# Patient Record
Sex: Female | Born: 1935
Health system: Southern US, Community
[De-identification: ages and names within clinical notes are randomized; demographics above are authoritative.]

## PROBLEM LIST (undated history)

## (undated) DIAGNOSIS — M1990 Unspecified osteoarthritis, unspecified site: Secondary | ICD-10-CM

## (undated) DIAGNOSIS — F329 Major depressive disorder, single episode, unspecified: Secondary | ICD-10-CM

## (undated) DIAGNOSIS — IMO0001 Reserved for inherently not codable concepts without codable children: Secondary | ICD-10-CM

## (undated) DIAGNOSIS — M545 Low back pain, unspecified: Secondary | ICD-10-CM

## (undated) DIAGNOSIS — K219 Gastro-esophageal reflux disease without esophagitis: Secondary | ICD-10-CM

## (undated) DIAGNOSIS — F419 Anxiety disorder, unspecified: Secondary | ICD-10-CM

## (undated) DIAGNOSIS — N952 Postmenopausal atrophic vaginitis: Secondary | ICD-10-CM

## (undated) DIAGNOSIS — K259 Gastric ulcer, unspecified as acute or chronic, without hemorrhage or perforation: Secondary | ICD-10-CM

## (undated) DIAGNOSIS — K2270 Barrett's esophagus without dysplasia: Secondary | ICD-10-CM

## (undated) DIAGNOSIS — E785 Hyperlipidemia, unspecified: Secondary | ICD-10-CM

## (undated) DIAGNOSIS — M359 Systemic involvement of connective tissue, unspecified: Secondary | ICD-10-CM

## (undated) DIAGNOSIS — R351 Nocturia: Secondary | ICD-10-CM

## (undated) DIAGNOSIS — N343 Urethral syndrome, unspecified: Secondary | ICD-10-CM

## (undated) DIAGNOSIS — N362 Urethral caruncle: Secondary | ICD-10-CM

## (undated) DIAGNOSIS — R339 Retention of urine, unspecified: Secondary | ICD-10-CM

## (undated) DIAGNOSIS — J189 Pneumonia, unspecified organism: Secondary | ICD-10-CM

## (undated) DIAGNOSIS — F32A Depression, unspecified: Secondary | ICD-10-CM

## (undated) DIAGNOSIS — N35919 Unspecified urethral stricture, male, unspecified site: Secondary | ICD-10-CM

## (undated) HISTORY — DX: Postmenopausal atrophic vaginitis: N95.2

## (undated) HISTORY — DX: Anxiety disorder, unspecified: F41.9

## (undated) HISTORY — PX: COLONOSCOPY: SHX174

## (undated) HISTORY — DX: Low back pain, unspecified: M54.50

## (undated) HISTORY — DX: Unspecified urethral stricture, male, unspecified site: N35.919

## (undated) HISTORY — DX: Retention of urine, unspecified: R33.9

## (undated) HISTORY — PX: REDUCTION MAMMAPLASTY: SUR839

## (undated) HISTORY — PX: DILATION AND CURETTAGE OF UTERUS: SHX78

## (undated) HISTORY — PX: ABDOMINAL HYSTERECTOMY: SHX81

## (undated) HISTORY — PX: BLADDER SURGERY: SHX569

## (undated) HISTORY — DX: Low back pain: M54.5

## (undated) HISTORY — DX: Nocturia: R35.1

## (undated) HISTORY — DX: Urethral syndrome, unspecified: N34.3

## (undated) HISTORY — DX: Urethral caruncle: N36.2

## (undated) HISTORY — PX: NASAL SINUS SURGERY: SHX719

## (undated) HISTORY — DX: Reserved for inherently not codable concepts without codable children: IMO0001

---

## 2003-06-13 ENCOUNTER — Other Ambulatory Visit: Payer: Self-pay

## 2005-03-24 ENCOUNTER — Ambulatory Visit: Payer: Self-pay | Admitting: Otolaryngology

## 2008-02-20 ENCOUNTER — Ambulatory Visit: Payer: Self-pay | Admitting: Unknown Physician Specialty

## 2010-09-16 ENCOUNTER — Ambulatory Visit: Payer: Self-pay | Admitting: Internal Medicine

## 2010-11-25 ENCOUNTER — Observation Stay: Payer: Self-pay | Admitting: Cardiology

## 2010-11-27 ENCOUNTER — Emergency Department: Payer: Self-pay | Admitting: Emergency Medicine

## 2010-12-01 ENCOUNTER — Observation Stay: Payer: Self-pay | Admitting: Internal Medicine

## 2010-12-09 ENCOUNTER — Ambulatory Visit: Payer: Self-pay | Admitting: Unknown Physician Specialty

## 2011-03-18 ENCOUNTER — Ambulatory Visit: Payer: Self-pay | Admitting: Unknown Physician Specialty

## 2011-08-11 DIAGNOSIS — J189 Pneumonia, unspecified organism: Secondary | ICD-10-CM

## 2011-08-11 HISTORY — DX: Pneumonia, unspecified organism: J18.9

## 2012-04-16 ENCOUNTER — Emergency Department: Payer: Self-pay | Admitting: Emergency Medicine

## 2012-04-19 ENCOUNTER — Ambulatory Visit: Payer: Self-pay | Admitting: Anesthesiology

## 2012-04-19 LAB — BASIC METABOLIC PANEL
Calcium, Total: 8.8 mg/dL (ref 8.5–10.1)
Chloride: 106 mmol/L (ref 98–107)
Co2: 30 mmol/L (ref 21–32)
Creatinine: 0.91 mg/dL (ref 0.60–1.30)
EGFR (African American): >60
EGFR (Non-African Amer.): >60
Glucose: 97 mg/dL (ref 65–99)
Potassium: 3.9 mmol/L (ref 3.5–5.1)
Sodium: 142 mmol/L (ref 136–145)

## 2012-04-19 LAB — CBC WITH DIFFERENTIAL/PLATELET
Basophil %: 0.5 %
Eosinophil #: 0.3 10*3/uL (ref 0.0–0.7)
HCT: 38.7 % (ref 35.0–47.0)
HGB: 12.8 g/dL (ref 12.0–16.0)
MCH: 29.2 pg (ref 26.0–34.0)
MCHC: 33.1 g/dL (ref 32.0–36.0)
Monocyte #: 0.9 x10 3/mm (ref 0.2–0.9)
Neutrophil #: 5.3 10*3/uL (ref 1.4–6.5)
Neutrophil %: 52.3 %
RBC: 4.38 10*6/uL (ref 3.80–5.20)
RDW: 14.2 % (ref 11.5–14.5)

## 2012-04-21 ENCOUNTER — Ambulatory Visit: Payer: Self-pay | Admitting: Otolaryngology

## 2012-04-25 LAB — WOUND CULTURE

## 2012-05-12 LAB — CULTURE, FUNGUS WITHOUT SMEAR

## 2012-10-06 ENCOUNTER — Ambulatory Visit: Payer: Self-pay

## 2012-10-17 ENCOUNTER — Ambulatory Visit: Payer: Self-pay

## 2013-09-06 ENCOUNTER — Ambulatory Visit: Payer: Self-pay

## 2014-03-13 ENCOUNTER — Ambulatory Visit: Payer: Self-pay | Admitting: Physician Assistant

## 2014-05-07 DIAGNOSIS — R61 Generalized hyperhidrosis: Secondary | ICD-10-CM | POA: Insufficient documentation

## 2014-10-09 DIAGNOSIS — R519 Headache, unspecified: Secondary | ICD-10-CM | POA: Insufficient documentation

## 2014-10-09 DIAGNOSIS — F322 Major depressive disorder, single episode, severe without psychotic features: Secondary | ICD-10-CM | POA: Insufficient documentation

## 2014-10-09 DIAGNOSIS — R51 Headache: Secondary | ICD-10-CM

## 2014-11-27 NOTE — Op Note (Signed)
PATIENT NAME:  Katrina Mckinney, Katrina Mckinney MR#:  [id number] DATE OF BIRTH:  1935/09/21  DATE OF PROCEDURE:  04/21/2012  PREOPERATIVE DIAGNOSIS: Chronic pansinusitis (status post remote sinus surgery on the ethmoid and maxillary sinuses).   POSTOPERATIVE DIAGNOSIS: Chronic pansinusitis (status post remote sinus surgery on the ethmoid and maxillary sinuses).   PROCEDURES:  1. Image guided sinus surgery.  2. Bilateral frontal sinusotomies with tissue removal.  3. Bilateral sphenoidectomies with tissue removal.  4. Revision bilateral anterior and posterior ethmoidectomies with tissue removal.  5. Revision bilateral maxillary sinus antrostomies with tissue removal.   SURGEON: Janalee Dane, MD  ANESTHESIA: General endotracheal.   FINDINGS: The middle turbinate remnants on both sides were prolapsed into the maxillary sinus obstructing outflow. There was a mucopyocele in the left frontal sinus and there was chronic bone changes.   COMPLICATIONS: None.    DESCRIPTION OF PROCEDURE:  The image-guided sinus surgery system mask was attached and registration was carried out in the standard fashion using the appropriate fiduciary points. Calibration of the system was confirmed and extensive review of the CT scan in all three dimensions preoperatively and intraoperatively was carried out.  Each instrument was registered and confirmed for anatomic accuracy.  Attention was directed to the sinus surgery portion of the surgery. The left side was addressed first. The middle turbinate was taken down subtotally with a through-cutting forceps. The uncinate process was then identified and completely resected revealing the natural maxillary sinus ostium. The postsurgically created maxillary antrostomy, which was obstructed with the lateralized middle turbinate remnant, was enlarged, removing diseased tissue. The maxillary antrum was then irrigated copiously with saline. Attention was directed to the ethmoid sinuses where  the postsurgically altered ethmoid sinuse cavities were taken down from anterior to posterior preserving the skull base and the lamina papyracea, removing diseased tissue. After the ethmoid sinuses had been completely cleaned out of polypoid chronically inflamed mucosa preserving the mucosa on the medial side of the middle turbinate remnant along the skull base and along the lamina papyracea, attention was directed to the sphenoid recess. The sphenoid sinus ostium was identified and progressively enlarged with Kerrison Rongeur, staying inferior and medial in orientation, to create a large sphenoid sinus antrostomy, removing diseased tissue. The 30 degree scope was then used to explore the frontal recess. The frontal recess was progressively enlarged meticulously, removing diseased tissue, and the frontal sinus was then copiously irrigated with saline as was the sphenoid sinus. Attention was then directed to the right side where an identical series of procedures was accomplished. Upon completion of the sinus surgery, the Surgiflo was placed. A total of one unit of Surgiflo was used.  Temporary Telfa pledgets were placed and tied over the columella to prevent dislodging.   The patient was then returned to anesthesia, allowed to emerge from anesthesia in the operating room, extubated, and taken to the recovery room in stable condition.  ____________________________ Lenna Sciara. Nadeen Landau, MD jmc:cms D: 04/21/2012 11:57:50 ET T: 04/21/2012 12:55:56 ET JOB#: [id number]  cc: Janalee Dane, MD, <Dictator>  Nicholos Johns MD ELECTRONICALLY SIGNED 04/23/2012 10:47

## 2015-02-13 ENCOUNTER — Encounter: Payer: Self-pay | Admitting: *Deleted

## 2015-02-14 ENCOUNTER — Ambulatory Visit: Payer: Medicare Other | Admitting: Anesthesiology

## 2015-02-14 ENCOUNTER — Encounter: Payer: Self-pay | Admitting: Anesthesiology

## 2015-02-14 ENCOUNTER — Encounter
Admission: RE | Disposition: A | Discharge status: Home / Self Care | Payer: Self-pay | Source: Ambulatory Visit | Attending: Unknown Physician Specialty

## 2015-02-14 ENCOUNTER — Ambulatory Visit
Admission: RE | Admit: 2015-02-14 | Discharge: 2015-02-14 | Disposition: A | Discharge status: Home / Self Care | Payer: Medicare Other | Source: Ambulatory Visit | Attending: Unknown Physician Specialty | Admitting: Unknown Physician Specialty

## 2015-02-14 DIAGNOSIS — E785 Hyperlipidemia, unspecified: Secondary | ICD-10-CM | POA: Diagnosis not present

## 2015-02-14 DIAGNOSIS — Z1211 Encounter for screening for malignant neoplasm of colon: Secondary | ICD-10-CM | POA: Diagnosis present

## 2015-02-14 DIAGNOSIS — Z79899 Other long term (current) drug therapy: Secondary | ICD-10-CM | POA: Diagnosis not present

## 2015-02-14 DIAGNOSIS — K210 Gastro-esophageal reflux disease with esophagitis: Secondary | ICD-10-CM | POA: Insufficient documentation

## 2015-02-14 DIAGNOSIS — D123 Benign neoplasm of transverse colon: Secondary | ICD-10-CM | POA: Diagnosis not present

## 2015-02-14 DIAGNOSIS — D122 Benign neoplasm of ascending colon: Secondary | ICD-10-CM | POA: Insufficient documentation

## 2015-02-14 DIAGNOSIS — K317 Polyp of stomach and duodenum: Secondary | ICD-10-CM | POA: Diagnosis not present

## 2015-02-14 DIAGNOSIS — R12 Heartburn: Secondary | ICD-10-CM | POA: Insufficient documentation

## 2015-02-14 DIAGNOSIS — K648 Other hemorrhoids: Secondary | ICD-10-CM | POA: Diagnosis not present

## 2015-02-14 DIAGNOSIS — I252 Old myocardial infarction: Secondary | ICD-10-CM | POA: Insufficient documentation

## 2015-02-14 DIAGNOSIS — Z79891 Long term (current) use of opiate analgesic: Secondary | ICD-10-CM | POA: Insufficient documentation

## 2015-02-14 DIAGNOSIS — F329 Major depressive disorder, single episode, unspecified: Secondary | ICD-10-CM | POA: Insufficient documentation

## 2015-02-14 DIAGNOSIS — Z86010 Personal history of colonic polyps: Secondary | ICD-10-CM | POA: Insufficient documentation

## 2015-02-14 DIAGNOSIS — M1990 Unspecified osteoarthritis, unspecified site: Secondary | ICD-10-CM | POA: Insufficient documentation

## 2015-02-14 HISTORY — DX: Hyperlipidemia, unspecified: E78.5

## 2015-02-14 HISTORY — DX: Gastric ulcer, unspecified as acute or chronic, without hemorrhage or perforation: K25.9

## 2015-02-14 HISTORY — DX: Depression, unspecified: F32.A

## 2015-02-14 HISTORY — PX: ESOPHAGOGASTRODUODENOSCOPY: SHX5428

## 2015-02-14 HISTORY — PX: COLONOSCOPY WITH PROPOFOL: SHX5780

## 2015-02-14 HISTORY — DX: Major depressive disorder, single episode, unspecified: F32.9

## 2015-02-14 HISTORY — DX: Gastro-esophageal reflux disease without esophagitis: K21.9

## 2015-02-14 HISTORY — DX: Barrett's esophagus without dysplasia: K22.70

## 2015-02-14 HISTORY — DX: Unspecified osteoarthritis, unspecified site: M19.90

## 2015-02-14 SURGERY — COLONOSCOPY WITH PROPOFOL
Anesthesia: General

## 2015-02-14 MED ORDER — SODIUM CHLORIDE 0.9 % IV SOLN
INTRAVENOUS | Status: DC
  Administered 2015-02-14: 09:00:00 via INTRAVENOUS

## 2015-02-14 MED ORDER — LIDOCAINE HCL (CARDIAC) 20 MG/ML IV SOLN
INTRAVENOUS | Status: DC | PRN
  Administered 2015-02-14: 100 mg via INTRAVENOUS

## 2015-02-14 MED ORDER — PROPOFOL INFUSION 10 MG/ML OPTIME
INTRAVENOUS | Status: DC | PRN
  Administered 2015-02-14: 100 ug/kg/min via INTRAVENOUS

## 2015-02-14 MED ORDER — PROPOFOL 10 MG/ML IV BOLUS
INTRAVENOUS | Status: DC | PRN
Start: 2015-02-14 — End: 2015-02-14
  Administered 2015-02-14: 30 mg via INTRAVENOUS
  Administered 2015-02-14: 7 mg via INTRAVENOUS

## 2015-02-14 MED ORDER — ALFENTANIL 500 MCG/ML IJ INJ
INJECTION | INTRAMUSCULAR | Status: DC | PRN
  Administered 2015-02-14: 500 ug via INTRAVENOUS

## 2015-02-14 MED ORDER — GLYCOPYRROLATE 0.2 MG/ML IJ SOLN
INTRAMUSCULAR | Status: DC | PRN
  Administered 2015-02-14: 0.2 mg via INTRAVENOUS

## 2015-02-14 MED ORDER — EPHEDRINE SULFATE 50 MG/ML IJ SOLN
INTRAMUSCULAR | Status: DC | PRN
Start: 2015-02-14 — End: 2015-02-14
  Administered 2015-02-14: 5 mg via INTRAVENOUS

## 2015-02-14 NOTE — Op Note (Signed)
[facility] Gastroenterology Patient Name: Katrina Mckinney Procedure Date: 02/14/2015 9:01 AM MRN: [id number] Account #: [id number] Date of Birth: 1936-03-16 Admit Type: Outpatient Age: 79 Room: [facility] ENDO ROOM 1 Gender: Female Note Status: Finalized Procedure:         Upper GI endoscopy Indications:       Epigastric abdominal pain, Heartburn, Follow-up of                     gastro-esophageal reflux disease Providers:         Manya Silvas, MD Referring MD:      Youlanda Roys. Ola Spurr, MD (Referring MD) Medicines:         Propofol per Anesthesia Complications:     No immediate complications. Procedure:         Pre-Anesthesia Assessment:                    - After reviewing the risks and benefits, the patient was                     deemed in satisfactory condition to undergo the procedure.                    After obtaining informed consent, the endoscope was passed                     under direct vision. Throughout the procedure, the                     patient's blood pressure, pulse, and oxygen saturations                     were monitored continuously. The Olympus GIF-160 endoscope                     (S#. [id number]) was introduced through the mouth, and                     advanced to the second part of duodenum. The upper GI                     endoscopy was accomplished without difficulty. The patient                     tolerated the procedure well. Findings:      LA Grade A (one or more mucosal breaks less than 5 mm, not extending       between tops of 2 mucosal folds) esophagitis with no bleeding was found       40 cm from the incisors. Biopsies were taken with a cold forceps for       histology.      The entire examined stomach was normal.      A single small-medium nodule was found in the duodenal bulb. Biopsies       were taken with a cold forceps for histology.      The 2nd part of the duodenum was normal. Impression:        - LA Grade A reflux  esophagitis. Biopsied.                    - Normal stomach.                    - Nodule found in the duodenum. Biopsied.                    -  Normal 2nd part of the duodenum. Recommendation:    - Await pathology results. Manya Silvas, MD 02/14/2015 9:14:37 AM This report has been signed electronically. Number of Addenda: 0 Note Initiated On: 02/14/2015 9:01 AM      [facility]

## 2015-02-14 NOTE — Anesthesia Postprocedure Evaluation (Signed)
  Anesthesia Post-op Note  Patient: Katrina Mckinney  Procedure(s) Performed: Procedure(s): COLONOSCOPY WITH PROPOFOL (N/A) ESOPHAGOGASTRODUODENOSCOPY (EGD) (N/A)  Anesthesia type:General  Patient location: PACU  Post pain: Pain level controlled  Post assessment: Post-op Vital signs reviewed, Patient's Cardiovascular Status Stable, Respiratory Function Stable, Patent Airway and No signs of Nausea or vomiting  Post vital signs: Reviewed and stable  Last Vitals:  Filed Vitals:   02/14/15 1020  BP: 148/48  Pulse: 70  Temp:   Resp: 16    Level of consciousness: awake, alert  and patient cooperative  Complications: No apparent anesthesia complications

## 2015-02-14 NOTE — Op Note (Signed)
[facility] Gastroenterology Patient Name: Katrina Mckinney Procedure Date: 02/14/2015 9:01 AM MRN: [id number] Account #: [id number] Date of Birth: Jul 09, 1936 Admit Type: Outpatient Age: 79 Room: [facility] ENDO ROOM 1 Gender: Female Note Status: Finalized Procedure:         Colonoscopy Indications:       Personal history of colonic polyps Providers:         Manya Silvas, MD Referring MD:      Youlanda Roys. Ola Spurr, MD (Referring MD) Medicines:         Propofol per Anesthesia Complications:     No immediate complications. Procedure:         Pre-Anesthesia Assessment:                    - After reviewing the risks and benefits, the patient was                     deemed in satisfactory condition to undergo the procedure.                    After obtaining informed consent, the colonoscope was                     passed under direct vision. Throughout the procedure, the                     patient's blood pressure, pulse, and oxygen saturations                     were monitored continuously. The Colonoscope was                     introduced through the anus and advanced to the the cecum,                     identified by appendiceal orifice and ileocecal valve. The                     colonoscopy was performed without difficulty. The patient                     tolerated the procedure well. The quality of the bowel                     preparation was adequate to identify polyps. Findings:      A 10 mm polyp was found in the ascending colon. The polyp was sessile.       The polyp was removed with a hot snare. Resection and retrieval were       complete. Two hemostatic clips were successfully placed (MRI       compatible). There was no bleeding during, and at the end, of the       procedure.      A small polyp was found at the hepatic flexure. The polyp was sessile.       The polyp was removed with a cold snare. Resection and retrieval were       complete.      A  small polyp was found in the transverse colon. The polyp was sessile.       The polyp was removed with a cold snare. Resection and retrieval were       complete.      A diminutive polyp was found in the transverse colon. The  polyp was       sessile. The polyp was removed with a jumbo cold forceps. Resection and       retrieval were complete.      Internal hemorrhoids were found during endoscopy. The hemorrhoids were       small-medium and Grade I (internal hemorrhoids that do not prolapse). Impression:        - One 10 mm polyp in the ascending colon. Resected and                     retrieved. Clips were placed.                    - One small polyp at the hepatic flexure. Resected and                     retrieved.                    - One small polyp in the transverse colon. Resected and                     retrieved.                    - One diminutive polyp in the transverse colon. Resected                     and retrieved.                    - Internal hemorrhoids. Recommendation:    - Await pathology results. Manya Silvas, MD 02/14/2015 9:46:04 AM This report has been signed electronically. Number of Addenda: 0 Note Initiated On: 02/14/2015 9:01 AM Scope Withdrawal Time: 0 hours 19 minutes 7 seconds  Total Procedure Duration: 0 hours 26 minutes 15 seconds       [facility]

## 2015-02-14 NOTE — Transfer of Care (Signed)
Immediate Anesthesia Transfer of Care Note  Patient: Katrina Mckinney [id number]  Procedure(s) Performed: Procedure(s): COLONOSCOPY WITH PROPOFOL (N/A) ESOPHAGOGASTRODUODENOSCOPY (EGD) (N/A)  Patient Location: PACU  Anesthesia Type:General  Level of Consciousness: awake and sedated  Airway & Oxygen Therapy: Patient Spontanous Breathing and Patient connected to nasal cannula oxygen  Post-op Assessment: Report given to RN and Post -op Vital signs reviewed and stable  Post vital signs: Reviewed and stable  Last Vitals:  Filed Vitals:   02/14/15 0951  BP: 144/52  Pulse:   Temp: 36.1 C  Resp: 13    Complications: No apparent anesthesia complications

## 2015-02-14 NOTE — H&P (Signed)
Primary Care Physician:  Adrian Prows, MD Primary Gastroenterologist:  Dr. Vira Agar  Pre-Procedure History & Physical: HPI:  Katrina Mckinney is a 79 y.o. female is here for an endoscopy and colonoscopy.   Past Medical History  Diagnosis Date  . GERD (gastroesophageal reflux disease)   . Depression   . Barrett's esophagus   . Hyperlipidemia   . Gastric ulcer   . Arthritis     Past Surgical History  Procedure Laterality Date  . Abdominal hysterectomy    . Reduction mammaplasty    . Colonoscopy      Prior to Admission medications   Medication Sig Start Date End Date Taking? Authorizing Provider  citalopram (CELEXA) 20 MG tablet Take 20 mg by mouth daily.   Yes Historical Provider, MD  HYDROcodone-acetaminophen (NORCO/VICODIN) 5-325 MG per tablet Take 1 tablet by mouth every 6 (six) hours as needed for moderate pain.   Yes Historical Provider, MD  mirtazapine (REMERON) 30 MG tablet Take 30 mg by mouth at bedtime.   Yes Historical Provider, MD  omeprazole (PRILOSEC) 20 MG capsule Take 20 mg by mouth daily.   Yes Historical Provider, MD  ranitidine (ZANTAC) 150 MG tablet Take 150 mg by mouth 2 (two) times daily.   Yes Historical Provider, MD  traMADol-acetaminophen (ULTRACET) 37.5-325 MG per tablet Take 1 tablet by mouth every 6 (six) hours as needed.   Yes Historical Provider, MD    Allergies as of 01/29/2015  . (Not on File)    History reviewed. No pertinent family history.  History   Social History  . Marital Status: Widowed    Spouse Name: N/A  . Number of Children: N/A  . Years of Education: N/A   Occupational History  . Not on file.   Social History Main Topics  . Smoking status: Not on file  . Smokeless tobacco: Not on file  . Alcohol Use: Not on file  . Drug Use: Not on file  . Sexual Activity: Not on file   Other Topics Concern  . Not on file   Social History Narrative    Review of Systems: See HPI, otherwise negative ROS  Physical  Exam: There were no vitals taken for this visit. General:   Alert,  pleasant and cooperative in NAD Head:  Normocephalic and atraumatic. Neck:  Supple; no masses or thyromegaly. Lungs:  Clear throughout to auscultation.    Heart:  Regular rate and rhythm. Abdomen:  Soft, nontender and nondistended. Normal bowel sounds, without guarding, and without rebound.   Neurologic:  Alert and  oriented x4;  grossly normal neurologically.  Impression/Plan: Katrina Mckinney is here for an endoscopy and colonoscopy to be performed for personal history of colon polyps and GERD and epigastric Abd pain.  Risks, benefits, limitations, and alternatives regarding  endoscopy and colonoscopy have been reviewed with the patient.  Questions have been answered.  All parties agreeable.   Gaylyn Cheers, MD  02/14/2015, 8:44 AM   Primary Care Physician:  Adrian Prows, MD Primary Gastroenterologist:  Dr. Vira Agar  Pre-Procedure History & Physical: HPI:  Katrina Mckinney is a 79 y.o. female is here for an endoscopy and colonoscopy.   Past Medical History  Diagnosis Date  . GERD (gastroesophageal reflux disease)   . Depression   . Barrett's esophagus   . Hyperlipidemia   . Gastric ulcer   . Arthritis     Past Surgical History  Procedure Laterality Date  . Abdominal hysterectomy    . Reduction  mammaplasty    . Colonoscopy      Prior to Admission medications   Medication Sig Start Date End Date Taking? Authorizing Provider  citalopram (CELEXA) 20 MG tablet Take 20 mg by mouth daily.   Yes Historical Provider, MD  HYDROcodone-acetaminophen (NORCO/VICODIN) 5-325 MG per tablet Take 1 tablet by mouth every 6 (six) hours as needed for moderate pain.   Yes Historical Provider, MD  mirtazapine (REMERON) 30 MG tablet Take 30 mg by mouth at bedtime.   Yes Historical Provider, MD  omeprazole (PRILOSEC) 20 MG capsule Take 20 mg by mouth daily.   Yes Historical Provider, MD  ranitidine (ZANTAC) 150 MG  tablet Take 150 mg by mouth 2 (two) times daily.   Yes Historical Provider, MD  traMADol-acetaminophen (ULTRACET) 37.5-325 MG per tablet Take 1 tablet by mouth every 6 (six) hours as needed.   Yes Historical Provider, MD    Allergies as of 01/29/2015  . (Not on File)    History reviewed. No pertinent family history.  History   Social History  . Marital Status: Widowed    Spouse Name: N/A  . Number of Children: N/A  . Years of Education: N/A   Occupational History  . Not on file.   Social History Main Topics  . Smoking status: Not on file  . Smokeless tobacco: Not on file  . Alcohol Use: Not on file  . Drug Use: Not on file  . Sexual Activity: Not on file   Other Topics Concern  . Not on file   Social History Narrative    Review of Systems: See HPI, otherwise negative ROS  Physical Exam: There were no vitals taken for this visit. General:   Alert,  pleasant and cooperative in NAD Head:  Normocephalic and atraumatic. Neck:  Supple; no masses or thyromegaly. Lungs:  Clear throughout to auscultation.    Heart:  Regular rate and rhythm. Abdomen:  Soft, nontender and nondistended. Normal bowel sounds, without guarding, and without rebound.   Neurologic:  Alert and  oriented x4;  grossly normal neurologically.  Impression/Plan: Katrina Mckinney [id number] is here for an endoscopy and colonoscopy to be performed for [facility] of colon polyps aNd GERD and epigastric abd pain  Risks, benefits, limitations, and alternatives regarding  endoscopy and colonoscopy have been reviewed with the patient.  Questions have been answered.  All parties agreeable.   Gaylyn Cheers, MD  02/14/2015, 8:44 AM

## 2015-02-14 NOTE — Anesthesia Preprocedure Evaluation (Addendum)
Anesthesia Evaluation  Patient identified by MRN, date of birth, ID band Patient awake    Reviewed: Allergy & Precautions, H&P , NPO status , Patient's Chart, lab work & pertinent test results, reviewed documented beta blocker date and time   Airway Mallampati: II  TM Distance: >3 FB Neck ROM: full    Dental  (+) Chipped   Pulmonary neg pulmonary ROS,  breath sounds clear to auscultation  Pulmonary exam normal       Cardiovascular - Past MI negative cardio ROS Normal cardiovascular examRhythm:regular Rate:Normal     Neuro/Psych PSYCHIATRIC DISORDERS negative neurological ROS     GI/Hepatic Neg liver ROS, PUD, GERD-  Medicated and Controlled,  Endo/Other  negative endocrine ROS  Renal/GU negative Renal ROS  negative genitourinary   Musculoskeletal  (+) Arthritis -,   Abdominal   Peds  Hematology negative hematology ROS (+)   Anesthesia Other Findings   Reproductive/Obstetrics negative OB ROS                           HPI Past Medical History  Diagnosis Date  . GERD (gastroesophageal reflux disease)   . Depression   . Barrett's esophagus   . Hyperlipidemia   . Gastric ulcer   . Arthritis     Anesthesia Physical Anesthesia Plan  ASA: III  Anesthesia Plan: General   Post-op Pain Management:    Induction:   Airway Management Planned:   Additional Equipment:   Intra-op Plan:   Post-operative Plan:   Informed Consent: I have reviewed the patients History and Physical, chart, labs and discussed the procedure including the risks, benefits and alternatives for the proposed anesthesia with the patient or authorized representative who has indicated his/her understanding and acceptance.   Dental Advisory Given  Plan Discussed with: Anesthesiologist, CRNA and Surgeon  Anesthesia Plan Comments:         Anesthesia Quick Evaluation

## 2015-02-15 ENCOUNTER — Encounter: Payer: Self-pay | Admitting: Unknown Physician Specialty

## 2015-02-18 LAB — SURGICAL PATHOLOGY

## 2015-06-17 ENCOUNTER — Other Ambulatory Visit: Payer: Self-pay | Admitting: Infectious Diseases

## 2015-06-17 DIAGNOSIS — M546 Pain in thoracic spine: Secondary | ICD-10-CM

## 2015-06-17 DIAGNOSIS — G44221 Chronic tension-type headache, intractable: Secondary | ICD-10-CM

## 2015-06-17 DIAGNOSIS — R634 Abnormal weight loss: Secondary | ICD-10-CM

## 2015-06-17 DIAGNOSIS — R61 Generalized hyperhidrosis: Secondary | ICD-10-CM

## 2015-06-18 DIAGNOSIS — K760 Fatty (change of) liver, not elsewhere classified: Secondary | ICD-10-CM | POA: Insufficient documentation

## 2015-06-21 ENCOUNTER — Encounter: Payer: Self-pay | Admitting: *Deleted

## 2015-06-25 ENCOUNTER — Ambulatory Visit: Payer: Medicare Other

## 2015-06-25 ENCOUNTER — Ambulatory Visit: Admission: RE | Admit: 2015-06-25 | Payer: Medicare Other | Source: Ambulatory Visit

## 2015-06-27 ENCOUNTER — Ambulatory Visit (INDEPENDENT_AMBULATORY_CARE_PROVIDER_SITE_OTHER): Payer: Medicare Other | Admitting: Urology

## 2015-06-27 ENCOUNTER — Encounter: Payer: Self-pay | Admitting: Urology

## 2015-06-27 VITALS — BP 151/72 | HR 80 | Ht 64.0 in | Wt 158.1 lb

## 2015-06-27 DIAGNOSIS — R350 Frequency of micturition: Secondary | ICD-10-CM | POA: Diagnosis not present

## 2015-06-27 DIAGNOSIS — IMO0002 Reserved for concepts with insufficient information to code with codable children: Secondary | ICD-10-CM

## 2015-06-27 DIAGNOSIS — N359 Urethral stricture, unspecified: Secondary | ICD-10-CM | POA: Diagnosis not present

## 2015-06-27 LAB — URINALYSIS, COMPLETE
Bilirubin, UA: NEGATIVE
Glucose, UA: NEGATIVE
Ketones, UA: NEGATIVE
Leukocytes, UA: NEGATIVE
NITRITE UA: NEGATIVE
PH UA: 5 (ref 5.0–7.5)
Specific Gravity, UA: >1.03 — ABNORMAL HIGH (ref 1.005–1.030)
UUROB: 0.2 mg/dL (ref 0.2–1.0)

## 2015-06-27 LAB — MICROSCOPIC EXAMINATION
BACTERIA UA: NONE SEEN
RBC MICROSCOPIC, UA: NONE SEEN /HPF (ref 0–?)

## 2015-06-27 LAB — BLADDER SCAN AMB NON-IMAGING: SCAN RESULT: 0

## 2015-06-27 NOTE — Progress Notes (Signed)
06/27/2015 8:38 AM   Katrina Mckinney February 21, 1936 [id number]  Referring provider: Adrian Prows, MD [place], [place] [address]  Chief Complaint  Patient presents with  . Urinary Frequency    HPI: Patient is a 79 year old white female who presents today for requesting urethral dilation.  She states the doctors at [facility] told her she needs her urethra dilated every four months.  Today, she is experiencing frequency, nocturia, incontinence and intermittency.  She is not having gross hematuria, dysuria or suprapubic pain.  She is not having fevers, chills, nausea or vomiting.     PMH: Past Medical History  Diagnosis Date  . GERD (gastroesophageal reflux disease)   . Depression   . Barrett's esophagus   . Hyperlipidemia   . Gastric ulcer   . Arthritis   . Atrophic vaginitis   . Urethral syndrome   . Low back pain   . Incomplete bladder emptying   . Anxiety   . Urethral caruncle   . Nocturia   . Urethral stricture   . Hypertension   . Frequency     Surgical History: Past Surgical History  Procedure Laterality Date  . Abdominal hysterectomy    . Reduction mammaplasty    . Colonoscopy    . Colonoscopy with propofol N/A 02/14/2015    Procedure: COLONOSCOPY WITH PROPOFOL;  Surgeon: Manya Silvas, MD;  Location: [facility] ENDOSCOPY;  Service: Endoscopy;  Laterality: N/A;  . Esophagogastroduodenoscopy N/A 02/14/2015    Procedure: ESOPHAGOGASTRODUODENOSCOPY (EGD);  Surgeon: Manya Silvas, MD;  Location: [facility] ENDOSCOPY;  Service: Endoscopy;  Laterality: N/A;  . Nasal sinus surgery      x 3  . Bladder surgery    . Dilation and curettage of uterus      Home Medications:    Medication List       This list is accurate as of: 06/27/15 11:59 PM.  Always use your most recent med list.               cetirizine 10 MG tablet  Commonly known as:  ZYRTEC  Take 10 mg by mouth.     citalopram 10 MG tablet    Commonly known as:  CELEXA  TAKE 1 TABLET (10 MG TOTAL) BY MOUTH ONCE DAILY.     fluticasone 50 MCG/ACT nasal spray  Commonly known as:  FLONASE  2 sprays by Each Nare route Two (2) times a day.     HYDROcodone-acetaminophen 5-325 MG tablet  Commonly known as:  NORCO/VICODIN  Take 1 tablet by mouth every 6 (six) hours as needed for moderate pain.     mirtazapine 30 MG tablet  Commonly known as:  REMERON  Take 30 mg by mouth at bedtime.     NASONEX 50 MCG/ACT nasal spray  Generic drug:  mometasone  Place into the nose.     omeprazole 20 MG capsule  Commonly known as:  PRILOSEC  Take 20 mg by mouth daily.     ranitidine 150 MG tablet  Commonly known as:  ZANTAC  Take 150 mg by mouth 2 (two) times daily.     sucralfate 1 G tablet  Commonly known as:  CARAFATE  Take by mouth.     traMADol-acetaminophen 37.5-325 MG tablet  Commonly known as:  ULTRACET  Take 1 tablet by mouth every 6 (six) hours as needed.     traZODone 50 MG tablet  Commonly known as:  DESYREL  TAKE 1 TABLET NIGHTLY AS NEEDED FOR SLEEP. START 1/2 TAB AT NIGHT, THEN INCREASE TO 1 AFTER 1 WEEK     Vitamin D (Ergocalciferol) 50000 UNITS Caps capsule  Commonly known as:  DRISDOL  Take by mouth.        Allergies:  Allergies  Allergen Reactions  . Aspirin   . Celecoxib Other (See Comments)  . Penicillin G Other (See Comments)    Mouth breaks out.  Marland Kitchen Penicillins     Family History: Family History  Problem Relation Age of Onset  . Kidney disease Neg Hx   . Bladder Cancer Neg Hx     Social History:  reports that she has been smoking.  She does not have any smokeless tobacco history on file. She reports that she does not drink alcohol or use illicit drugs.  ROS: UROLOGY Frequent Urination?: Yes Hard to postpone urination?: No Burning/pain with urination?: No Get up at night to urinate?: Yes Leakage of urine?: Yes Urine stream starts and stops?: Yes Trouble starting stream?: No Do you have  to strain to urinate?: No Blood in urine?: No Urinary tract infection?: No Sexually transmitted disease?: No Injury to kidneys or bladder?: No Painful intercourse?: No Weak stream?: No Currently pregnant?: No Vaginal bleeding?: No Last menstrual period?: n  Gastrointestinal Nausea?: Yes Vomiting?: No Indigestion/heartburn?: Yes Diarrhea?: No Constipation?: No  Constitutional Fever: No Night sweats?: No Weight loss?: Yes Fatigue?: No  Skin Skin rash/lesions?: No Itching?: No  Eyes Blurred vision?: No Double vision?: No  Ears/Nose/Throat Sore throat?: No Sinus problems?: Yes  Hematologic/Lymphatic Swollen glands?: No Easy bruising?: Yes  Cardiovascular Leg swelling?: No Chest pain?: No  Respiratory Cough?: No Shortness of breath?: No  Endocrine Excessive thirst?: No  Musculoskeletal Back pain?: No Joint pain?: No  Neurological Headaches?: Yes Dizziness?: No  Psychologic Depression?: No Anxiety?: No  Physical Exam: BP 151/72 mmHg  Pulse 80  Ht 5\' 4"  (1.626 m)  Wt 158 lb 1.6 oz (71.714 kg)  BMI 27.12 kg/m2  Constitutional: Well nourished. Alert and oriented, No acute distress. HEENT: [place] AT, moist mucus membranes. Trachea midline, no masses. Cardiovascular: No clubbing, cyanosis, or edema. Respiratory: Normal respiratory effort, no increased work of breathing. GI: Abdomen is soft, non tender, non distended, no abdominal masses. Liver and spleen not palpable.  No hernias appreciated.  Stool sample for occult testing is not indicated.   GU: No CVA tenderness.  No bladder fullness or masses.  Atrophic external genitalia, normal pubic hair distribution, no lesions.  Normal urethral meatus, no lesions, no prolapse, no discharge.   No urethral masses, tenderness and/or tenderness. No bladder fullness, tenderness or masses.  Skin: No rashes, bruises or suspicious lesions. Lymph: No cervical or inguinal adenopathy. Neurologic: Grossly intact, no focal  deficits, moving all 4 extremities. Psychiatric: Normal mood and affect.  Laboratory Data: Lab Results  Component Value Date   WBC 10.0 04/19/2012   HGB 12.8 04/19/2012   HCT 38.7 04/19/2012   MCV 88 04/19/2012   PLT 227 04/19/2012    Lab Results  Component Value Date   CREATININE 0.91 04/19/2012     Urinalysis Results for orders placed or performed in visit on 06/27/15  CULTURE, URINE COMPREHENSIVE  Result Value Ref Range   Urine Culture, Comprehensive Final report    Result 1 Comment   Microscopic Examination  Result Value Ref Range   WBC, UA 0-5 0 -  5 /hpf   RBC, UA None seen 0 -  2 /hpf  Epithelial Cells (non renal) 0-10 0 - 10 /hpf   Mucus, UA Present (A) Not Estab.   Bacteria, UA None seen None seen/Few  Urinalysis, Complete  Result Value Ref Range   Specific Gravity, UA >1.030 (H) 1.005 - 1.030   pH, UA 5.0 5.0 - 7.5   Color, UA Yellow Yellow   Appearance Ur Clear Clear   Leukocytes, UA Negative Negative   Protein, UA Trace (A) Negative/Trace   Glucose, UA Negative Negative   Ketones, UA Negative Negative   RBC, UA Trace (A) Negative   Bilirubin, UA Negative Negative   Urobilinogen, Ur 0.2 0.2 - 1.0 mg/dL   Nitrite, UA Negative Negative   Microscopic Examination See below:   BLADDER SCAN AMB NON-IMAGING  Result Value Ref Range   Scan Result 0    Procedure: Patient is placed in stirrups and her urethral meatus and vulva are cleansed with Betadine.  2% Lidocaine jelly was inserted into her urethra.  I then dilated her with Elby Showers sounds to a 32f without difficultly.  She tolerated the procedure well.  She will return in 4 months.  Assessment & Plan:    1. Urethral stenosis:   Patient was dilated today.  She will return in 4 months for reevaluation.    2. Urinary frequency:    Patient has a long standing history of urinary frequency.  She has been tried on anticholinergics, Myrbetriq and PTNS without relief of her symptoms.  She would like to continue  the urethral dilations.    - Urinalysis, Complete - CULTURE, URINE COMPREHENSIVE - BLADDER SCAN AMB NON-IMAGING   Return in about 4 months (around 10/25/2015) for dilation.  Zara Council, [facility] [address], [place], [place] [address] [phone]

## 2015-06-28 ENCOUNTER — Ambulatory Visit
Admission: RE | Admit: 2015-06-28 | Discharge: 2015-06-28 | Disposition: A | Discharge status: Home / Self Care | Payer: Medicare Other | Source: Ambulatory Visit | Attending: Infectious Diseases | Admitting: Infectious Diseases

## 2015-06-28 DIAGNOSIS — R918 Other nonspecific abnormal finding of lung field: Secondary | ICD-10-CM | POA: Diagnosis not present

## 2015-06-28 DIAGNOSIS — R634 Abnormal weight loss: Secondary | ICD-10-CM | POA: Diagnosis present

## 2015-06-28 DIAGNOSIS — J432 Centrilobular emphysema: Secondary | ICD-10-CM | POA: Diagnosis not present

## 2015-06-28 DIAGNOSIS — R911 Solitary pulmonary nodule: Secondary | ICD-10-CM | POA: Diagnosis not present

## 2015-06-28 DIAGNOSIS — M546 Pain in thoracic spine: Secondary | ICD-10-CM | POA: Diagnosis present

## 2015-06-28 DIAGNOSIS — R61 Generalized hyperhidrosis: Secondary | ICD-10-CM | POA: Diagnosis present

## 2015-06-28 DIAGNOSIS — G44221 Chronic tension-type headache, intractable: Secondary | ICD-10-CM

## 2015-06-28 DIAGNOSIS — I2510 Atherosclerotic heart disease of native coronary artery without angina pectoris: Secondary | ICD-10-CM | POA: Diagnosis not present

## 2015-06-28 MED ORDER — IOHEXOL 300 MG/ML  SOLN
75.0000 mL | Freq: Once | INTRAMUSCULAR | Status: AC | PRN
  Administered 2015-06-28: 75 mL via INTRAVENOUS

## 2015-06-29 LAB — CULTURE, URINE COMPREHENSIVE

## 2015-07-01 DIAGNOSIS — IMO0002 Reserved for concepts with insufficient information to code with codable children: Secondary | ICD-10-CM | POA: Insufficient documentation

## 2015-07-01 DIAGNOSIS — R350 Frequency of micturition: Secondary | ICD-10-CM | POA: Insufficient documentation

## 2015-07-09 ENCOUNTER — Other Ambulatory Visit
Admission: RE | Admit: 2015-07-09 | Discharge: 2015-07-09 | Disposition: A | Discharge status: Home / Self Care | Payer: Medicare Other | Source: Ambulatory Visit | Attending: Nurse Practitioner | Admitting: Nurse Practitioner

## 2015-07-09 DIAGNOSIS — R197 Diarrhea, unspecified: Secondary | ICD-10-CM | POA: Insufficient documentation

## 2015-07-09 LAB — C DIFFICILE QUICK SCREEN W PCR REFLEX
C Diff antigen: NEGATIVE
C Diff interpretation: NEGATIVE
C Diff toxin: NEGATIVE

## 2015-07-11 LAB — STOOL CULTURE

## 2015-07-11 LAB — GIARDIA, EIA; OVA/PARASITE: GIARDIA AG STL: NEGATIVE

## 2015-07-11 LAB — O&P RESULT

## 2015-08-14 ENCOUNTER — Other Ambulatory Visit: Payer: Self-pay | Admitting: Infectious Diseases

## 2015-08-14 DIAGNOSIS — Z1231 Encounter for screening mammogram for malignant neoplasm of breast: Secondary | ICD-10-CM

## 2015-08-22 ENCOUNTER — Ambulatory Visit: Payer: Medicare Other | Attending: Infectious Diseases

## 2015-11-27 ENCOUNTER — Encounter: Payer: Self-pay | Admitting: *Deleted

## 2015-12-03 ENCOUNTER — Ambulatory Visit: Payer: Medicare Other | Admitting: Anesthesiology

## 2015-12-03 ENCOUNTER — Ambulatory Visit
Admission: RE | Admit: 2015-12-03 | Discharge: 2015-12-03 | Disposition: A | Discharge status: Home / Self Care | Payer: Medicare Other | Source: Ambulatory Visit | Attending: Ophthalmology | Admitting: Ophthalmology

## 2015-12-03 ENCOUNTER — Encounter
Admission: RE | Disposition: A | Discharge status: Home / Self Care | Payer: Self-pay | Source: Ambulatory Visit | Attending: Ophthalmology

## 2015-12-03 DIAGNOSIS — H2512 Age-related nuclear cataract, left eye: Secondary | ICD-10-CM | POA: Diagnosis present

## 2015-12-03 DIAGNOSIS — M549 Dorsalgia, unspecified: Secondary | ICD-10-CM | POA: Diagnosis not present

## 2015-12-03 DIAGNOSIS — N319 Neuromuscular dysfunction of bladder, unspecified: Secondary | ICD-10-CM | POA: Diagnosis not present

## 2015-12-03 DIAGNOSIS — Z8711 Personal history of peptic ulcer disease: Secondary | ICD-10-CM | POA: Diagnosis not present

## 2015-12-03 DIAGNOSIS — F418 Other specified anxiety disorders: Secondary | ICD-10-CM | POA: Diagnosis not present

## 2015-12-03 DIAGNOSIS — K219 Gastro-esophageal reflux disease without esophagitis: Secondary | ICD-10-CM | POA: Insufficient documentation

## 2015-12-03 DIAGNOSIS — Z880 Allergy status to penicillin: Secondary | ICD-10-CM | POA: Diagnosis not present

## 2015-12-03 DIAGNOSIS — F17200 Nicotine dependence, unspecified, uncomplicated: Secondary | ICD-10-CM | POA: Insufficient documentation

## 2015-12-03 DIAGNOSIS — G8929 Other chronic pain: Secondary | ICD-10-CM | POA: Insufficient documentation

## 2015-12-03 DIAGNOSIS — M1990 Unspecified osteoarthritis, unspecified site: Secondary | ICD-10-CM | POA: Insufficient documentation

## 2015-12-03 DIAGNOSIS — Z79899 Other long term (current) drug therapy: Secondary | ICD-10-CM | POA: Insufficient documentation

## 2015-12-03 DIAGNOSIS — K2270 Barrett's esophagus without dysplasia: Secondary | ICD-10-CM | POA: Diagnosis not present

## 2015-12-03 DIAGNOSIS — F329 Major depressive disorder, single episode, unspecified: Secondary | ICD-10-CM | POA: Insufficient documentation

## 2015-12-03 HISTORY — PX: CATARACT EXTRACTION W/PHACO: SHX586

## 2015-12-03 SURGERY — PHACOEMULSIFICATION, CATARACT, WITH IOL INSERTION
Anesthesia: Monitor Anesthesia Care | Site: Eye | Laterality: Left | Wound class: Clean

## 2015-12-03 MED ORDER — MOXIFLOXACIN HCL 0.5 % OP SOLN
OPHTHALMIC | Status: DC | PRN
  Administered 2015-12-03: 1 [drp] via OPHTHALMIC

## 2015-12-03 MED ORDER — NA CHONDROIT SULF-NA HYALURON 40-17 MG/ML IO SOLN
INTRAOCULAR | Status: DC | PRN
Start: 2015-12-03 — End: 2015-12-03
  Administered 2015-12-03: 1 mL via INTRAOCULAR

## 2015-12-03 MED ORDER — FENTANYL CITRATE (PF) 100 MCG/2ML IJ SOLN: 25.0000 ug | INTRAMUSCULAR | Status: DC | PRN

## 2015-12-03 MED ORDER — MOXIFLOXACIN HCL 0.5 % OP SOLN: 1.0000 [drp] | OPHTHALMIC | Status: DC | PRN

## 2015-12-03 MED ORDER — CARBACHOL 0.01 % IO SOLN
INTRAOCULAR | Status: DC | PRN
  Administered 2015-12-03: 0.5 mL via INTRAOCULAR

## 2015-12-03 MED ORDER — FENTANYL CITRATE (PF) 100 MCG/2ML IJ SOLN
INTRAMUSCULAR | Status: DC | PRN
  Administered 2015-12-03: 25 ug via INTRAVENOUS

## 2015-12-03 MED ORDER — SODIUM CHLORIDE 0.9 % IV SOLN
INTRAVENOUS | Status: DC
Start: 2015-12-03 — End: 2015-12-03
  Administered 2015-12-03: 09:00:00 via INTRAVENOUS

## 2015-12-03 MED ORDER — POVIDONE-IODINE 5 % OP SOLN
1.0000 "application " | Freq: Once | OPHTHALMIC | Status: AC
  Administered 2015-12-03: 1 via OPHTHALMIC

## 2015-12-03 MED ORDER — EPINEPHRINE HCL 1 MG/ML IJ SOLN
INTRAMUSCULAR | Status: AC
  Filled 2015-12-03: qty 1

## 2015-12-03 MED ORDER — ONDANSETRON HCL 4 MG/2ML IJ SOLN
4.0000 mg | Freq: Once | INTRAMUSCULAR | Status: DC | PRN
Start: 2015-12-03 — End: 2015-12-03

## 2015-12-03 MED ORDER — NA CHONDROIT SULF-NA HYALURON 40-17 MG/ML IO SOLN
INTRAOCULAR | Status: AC
  Filled 2015-12-03: qty 1

## 2015-12-03 MED ORDER — ARMC OPHTHALMIC DILATING GEL
1.0000 | OPHTHALMIC | Status: AC | PRN
Start: 2015-12-03 — End: 2015-12-03
  Administered 2015-12-03 (×2): 1 via OPHTHALMIC

## 2015-12-03 MED ORDER — MIDAZOLAM HCL 2 MG/2ML IJ SOLN
INTRAMUSCULAR | Status: DC | PRN
  Administered 2015-12-03: 0.5 mg via INTRAVENOUS

## 2015-12-03 MED ORDER — EPINEPHRINE HCL 1 MG/ML IJ SOLN
INTRAMUSCULAR | Status: DC | PRN
  Administered 2015-12-03: 10:00:00 via OPHTHALMIC

## 2015-12-03 MED ORDER — CEFUROXIME OPHTHALMIC INJECTION 1 MG/0.1 ML
INJECTION | OPHTHALMIC | Status: AC
  Filled 2015-12-03: qty 0.1

## 2015-12-03 MED ORDER — TETRACAINE HCL 0.5 % OP SOLN
1.0000 [drp] | Freq: Once | OPHTHALMIC | Status: AC
  Administered 2015-12-03: 1 [drp] via OPHTHALMIC

## 2015-12-03 SURGICAL SUPPLY — 23 items
CANNULA ANT/CHMB 27GA (MISCELLANEOUS) ×3 IMPLANT
CUP MEDICINE 2OZ PLAST GRAD ST (MISCELLANEOUS) ×3 IMPLANT
GLOVE BIO SURGEON STRL SZ8 (GLOVE) ×3 IMPLANT
GLOVE BIOGEL M 6.5 STRL (GLOVE) ×3 IMPLANT
GLOVE SURG LX 8.0 MICRO (GLOVE) ×2
GLOVE SURG LX STRL 8.0 MICRO (GLOVE) ×1 IMPLANT
GOWN STRL REUS W/ TWL LRG LVL3 (GOWN DISPOSABLE) ×2 IMPLANT
GOWN STRL REUS W/TWL LRG LVL3 (GOWN DISPOSABLE) ×4
LENS IOL TECNIS 21 (Intraocular Lens) ×1 IMPLANT
LENS IOL TECNIS 21.0 (Intraocular Lens) ×2 IMPLANT
LENS IOL TECNIS MONO 1P 21.0 (Intraocular Lens) ×1 IMPLANT
PACK CATARACT (MISCELLANEOUS) ×3 IMPLANT
PACK CATARACT BRASINGTON LX (MISCELLANEOUS) ×3 IMPLANT
PACK EYE AFTER SURG (MISCELLANEOUS) ×3 IMPLANT
SOL BSS BAG (MISCELLANEOUS) ×3
SOL PREP PVP 2OZ (MISCELLANEOUS) ×3
SOLUTION BSS BAG (MISCELLANEOUS) ×1 IMPLANT
SOLUTION PREP PVP 2OZ (MISCELLANEOUS) ×1 IMPLANT
SYR 3ML LL SCALE MARK (SYRINGE) ×3 IMPLANT
SYR 5ML LL (SYRINGE) ×3 IMPLANT
SYR TB 1ML 27GX1/2 LL (SYRINGE) ×3 IMPLANT
WATER STERILE IRR 1000ML POUR (IV SOLUTION) ×3 IMPLANT
WIPE NON LINTING 3.25X3.25 (MISCELLANEOUS) ×3 IMPLANT

## 2015-12-03 NOTE — H&P (Signed)
  All labs reviewed. Abnormal studies sent to patients PCP when indicated.  Previous H&P reviewed, patient examined, there are NO CHANGES.  Katrina Mckinney LOUIS4/25/20179:40 AM

## 2015-12-03 NOTE — Anesthesia Procedure Notes (Signed)
Date/Time: 12/03/2015 9:51 AM Performed by: Kennon Holter Pre-anesthesia Checklist: Timeout performed, Patient being monitored, Suction available, Emergency Drugs available and Patient identified Oxygen Delivery Method: Nasal cannula Placement Confirmation: positive ETCO2

## 2015-12-03 NOTE — Anesthesia Postprocedure Evaluation (Signed)
Anesthesia Post Note  Patient: Katrina Mckinney [id number]  Procedure(s) Performed: Procedure(s) (LRB): CATARACT EXTRACTION PHACO AND INTRAOCULAR LENS PLACEMENT (IOC) (Left)  Anesthesia Type: MAC Level of consciousness: awake and alert and oriented Pain management: pain level controlled Vital Signs Assessment: post-procedure vital signs reviewed and stable Respiratory status: spontaneous breathing Cardiovascular status: blood pressure returned to baseline Anesthetic complications: no    Last Vitals:  Filed Vitals:   12/03/15 1007 12/03/15 1013  BP:  144/48  Pulse:  66  Temp: 36.9 C 36.9 C  Resp:  16    Last Pain: There were no vitals filed for this visit.               Lucus Lambertson

## 2015-12-03 NOTE — Op Note (Signed)
PREOPERATIVE DIAGNOSIS:  Nuclear sclerotic cataract of the left eye.   POSTOPERATIVE DIAGNOSIS:  Nuclear sclerotic cataract of the left eye.   OPERATIVE PROCEDURE: Procedure(s): CATARACT EXTRACTION PHACO AND INTRAOCULAR LENS PLACEMENT (IOC)   SURGEON:  Birder Robson, MD.   ANESTHESIA:  Anesthesiologist: Alvin Critchley, MD CRNA: Kennon Holter, CRNA  1.      Managed anesthesia care. 2.      Topical tetracaine drops followed by 2% Xylocaine jelly applied in the preoperative holding area.   COMPLICATIONS:  None.   TECHNIQUE:   Stop and chop   DESCRIPTION OF PROCEDURE:  The patient was examined and consented in the preoperative holding area where the aforementioned topical anesthesia was applied to the left eye and then brought back to the Operating Room where the left eye was prepped and draped in the usual sterile ophthalmic fashion and a lid speculum was placed. A paracentesis was created with the side port blade and the anterior chamber was filled with viscoelastic. A near clear corneal incision was performed with the steel keratome. A continuous curvilinear capsulorrhexis was performed with a cystotome followed by the capsulorrhexis forceps. Hydrodissection and hydrodelineation were carried out with BSS on a blunt cannula. The lens was removed in a stop and chop  technique and the remaining cortical material was removed with the irrigation-aspiration handpiece. The capsular bag was inflated with viscoelastic and the Technis ZCB00 lens was placed in the capsular bag without complication. The remaining viscoelastic was removed from the eye with the irrigation-aspiration handpiece. The wounds were hydrated. The anterior chamber was flushed with Miostat and the eye was inflated to physiologic pressure. 0.2 mL of Vigamox diluted three/one with BSS was placed in the anterior chamber. The wounds were found to be water tight. The eye was dressed with Vigamox. The patient was given protective glasses to  wear throughout the day and a shield with which to sleep tonight. The patient was also given drops with which to begin a drop regimen today and will follow-up with me in one day. * No implants in log *  Procedure(s) with comments: CATARACT EXTRACTION PHACO AND INTRAOCULAR LENS PLACEMENT (IOC) (Left) - [place] 44.3 AP% 23.3 CDE 10.31 Fluid Pack Lot # [id number]  Electronically signed: [place] 12/03/2015 10:04 AM

## 2015-12-03 NOTE — Discharge Instructions (Signed)
AMBULATORY SURGERY  DISCHARGE INSTRUCTIONS   1) The drugs that you were given will stay in your system until tomorrow so for the next 24 hours you should not:  A) Drive an automobile B) Make any legal decisions C) Drink any alcoholic beverage   2) You may resume regular meals tomorrow.  Today it is better to start with liquids and gradually work up to solid foods.  You may eat anything you prefer, but it is better to start with liquids, then soup and crackers, and gradually work up to solid foods.   3) Please notify your doctor immediately if you have any unusual bleeding, trouble breathing, redness and pain at the surgery site, drainage, fever, or pain not relieved by medication.    4) Additional Instructions:        Please contact your physician with any problems or Same Day Surgery at [phone], Monday through Friday 6 am to 4 pm, or [place] [facility] number at [phone].    Eye Surgery Discharge Instructions  Expect mild scratchy sensation or mild soreness. DO NOT RUB YOUR EYE!  The day of surgery:  Minimal physical activity, but bed rest is not required  No reading, computer work, or close hand work  No bending, lifting, or straining.  May watch TV  For 24 hours:  No driving, legal decisions, or alcoholic beverages  Safety precautions  Eat anything you prefer: It is better to start with liquids, then soup then solid foods.  _____ Eye patch should be worn until postoperative exam tomorrow.  ____ Solar shield eyeglasses should be worn for comfort in the sunlight/patch while sleeping  Resume all regular medications including aspirin or Coumadin if these were discontinued prior to surgery. You may shower, bathe, shave, or wash your hair. Tylenol may be taken for mild discomfort.  Call your doctor if you experience significant pain, nausea, or vomiting, fever > 101 or other signs of infection. [phone] or [phone] Specific  instructions:  Follow-up Information    Follow up with PORFILIO,WILLIAM LOUIS, MD In 1 day.   Specialty:  Ophthalmology   Why:  April 26 at [facility] information:   [address] [place] [address] [phone]

## 2015-12-03 NOTE — Transfer of Care (Signed)
Immediate Anesthesia Transfer of Care Note  Patient: Katrina Mckinney [id number]  Procedure(s) Performed: Procedure(s) with comments: CATARACT EXTRACTION PHACO AND INTRAOCULAR LENS PLACEMENT (IOC) (Left) - [place] 44.3 AP% 23.3 CDE 10.31 Fluid Pack Lot # [id number]  Patient Location: PACU and Short Stay  Anesthesia Type:MAC  Level of Consciousness: awake, alert  and oriented  Airway & Oxygen Therapy: Patient Spontanous Breathing  Post-op Assessment: Report given to RN and Post -op Vital signs reviewed and stable  Post vital signs: Reviewed and stable  Last Vitals:  Filed Vitals:   12/03/15 0838 12/03/15 1007  BP: 119/42   Pulse: 69   Temp: 36.2 C 36.9 C  Resp: 16     Complications: No apparent anesthesia complications

## 2015-12-03 NOTE — Anesthesia Preprocedure Evaluation (Signed)
Anesthesia Evaluation  Patient identified by MRN, date of birth, ID band Patient awake    Reviewed: Allergy & Precautions, H&P , NPO status , Patient's Chart, lab work & pertinent test results, reviewed documented beta blocker date and time   Airway Mallampati: II  TM Distance: >3 FB Neck ROM: full    Dental  (+) Chipped   Pulmonary neg pulmonary ROS, Current Smoker,    Pulmonary exam normal breath sounds clear to auscultation       Cardiovascular hypertension, (-) Past MI negative cardio ROS Normal cardiovascular exam Rhythm:regular Rate:Normal     Neuro/Psych PSYCHIATRIC DISORDERS Anxiety Depression negative neurological ROS     GI/Hepatic Neg liver ROS, PUD, GERD  Medicated and Controlled,Barrett's esophagus   Endo/Other  negative endocrine ROS  Renal/GU negative Renal ROS Bladder dysfunction   negative genitourinary   Musculoskeletal  (+) Arthritis , Osteoarthritis,  Chronic back pain   Abdominal   Peds negative pediatric ROS (+)  Hematology negative hematology ROS (+)   Anesthesia Other Findings Increased frequency  Reproductive/Obstetrics negative OB ROS                            HPI Past Medical History  Diagnosis Date  . GERD (gastroesophageal reflux disease)   . Depression   . Barrett's esophagus   . Hyperlipidemia   . Gastric ulcer   . Arthritis   . Atrophic vaginitis   . Urethral syndrome   . Low back pain   . Incomplete bladder emptying   . Anxiety   . Urethral caruncle   . Nocturia   . Urethral stricture   . Hypertension   . Frequency     Anesthesia Physical  Anesthesia Plan  ASA: III  Anesthesia Plan: MAC   Post-op Pain Management:    Induction:   Airway Management Planned:   Additional Equipment:   Intra-op Plan:   Post-operative Plan:   Informed Consent: I have reviewed the patients History and Physical, chart, labs and discussed the  procedure including the risks, benefits and alternatives for the proposed anesthesia with the patient or authorized representative who has indicated his/her understanding and acceptance.   Dental Advisory Given  Plan Discussed with: Anesthesiologist, CRNA and Surgeon  Anesthesia Plan Comments:         Anesthesia Quick Evaluation

## 2016-01-01 ENCOUNTER — Encounter: Payer: Self-pay | Admitting: Ophthalmology

## 2016-01-28 ENCOUNTER — Encounter
Admission: RE | Disposition: A | Discharge status: Home / Self Care | Payer: Self-pay | Source: Ambulatory Visit | Attending: Ophthalmology

## 2016-01-28 ENCOUNTER — Ambulatory Visit
Admission: RE | Admit: 2016-01-28 | Discharge: 2016-01-28 | Disposition: A | Discharge status: Home / Self Care | Payer: Medicare Other | Source: Ambulatory Visit | Attending: Ophthalmology | Admitting: Ophthalmology

## 2016-01-28 ENCOUNTER — Ambulatory Visit: Payer: Medicare Other | Admitting: Anesthesiology

## 2016-01-28 ENCOUNTER — Encounter: Payer: Self-pay | Admitting: *Deleted

## 2016-01-28 DIAGNOSIS — J449 Chronic obstructive pulmonary disease, unspecified: Secondary | ICD-10-CM | POA: Insufficient documentation

## 2016-01-28 DIAGNOSIS — K219 Gastro-esophageal reflux disease without esophagitis: Secondary | ICD-10-CM | POA: Insufficient documentation

## 2016-01-28 DIAGNOSIS — H2511 Age-related nuclear cataract, right eye: Secondary | ICD-10-CM | POA: Insufficient documentation

## 2016-01-28 DIAGNOSIS — F17210 Nicotine dependence, cigarettes, uncomplicated: Secondary | ICD-10-CM | POA: Insufficient documentation

## 2016-01-28 HISTORY — DX: Pneumonia, unspecified organism: J18.9

## 2016-01-28 HISTORY — PX: CATARACT EXTRACTION W/PHACO: SHX586

## 2016-01-28 SURGERY — PHACOEMULSIFICATION, CATARACT, WITH IOL INSERTION
Anesthesia: Monitor Anesthesia Care | Site: Eye | Laterality: Right | Wound class: Clean

## 2016-01-28 MED ORDER — POVIDONE-IODINE 5 % OP SOLN
OPHTHALMIC | Status: AC
  Administered 2016-01-28: 1 via OPHTHALMIC
  Filled 2016-01-28: qty 30

## 2016-01-28 MED ORDER — CEFUROXIME OPHTHALMIC INJECTION 1 MG/0.1 ML
INJECTION | OPHTHALMIC | Status: AC
  Filled 2016-01-28: qty 0.1

## 2016-01-28 MED ORDER — CEFUROXIME OPHTHALMIC INJECTION 1 MG/0.1 ML
INJECTION | OPHTHALMIC | Status: DC | PRN
  Administered 2016-01-28: .1 mL via INTRACAMERAL

## 2016-01-28 MED ORDER — EPINEPHRINE HCL 1 MG/ML IJ SOLN
INTRAMUSCULAR | Status: AC
  Filled 2016-01-28: qty 1

## 2016-01-28 MED ORDER — NA CHONDROIT SULF-NA HYALURON 40-17 MG/ML IO SOLN
INTRAOCULAR | Status: DC | PRN
  Administered 2016-01-28: 1 mL via INTRAOCULAR

## 2016-01-28 MED ORDER — MOXIFLOXACIN HCL 0.5 % OP SOLN
1.0000 [drp] | OPHTHALMIC | Status: AC | PRN
  Administered 2016-01-28: 1 [drp] via OPHTHALMIC
  Filled 2016-01-28: qty 3

## 2016-01-28 MED ORDER — SODIUM CHLORIDE 0.9 % IV SOLN
INTRAVENOUS | Status: DC
  Administered 2016-01-28: 50 mL/h via INTRAVENOUS

## 2016-01-28 MED ORDER — MOXIFLOXACIN HCL 0.5 % OP SOLN
OPHTHALMIC | Status: AC
  Filled 2016-01-28: qty 3

## 2016-01-28 MED ORDER — ARMC OPHTHALMIC DILATING GEL
OPHTHALMIC | Status: AC
  Administered 2016-01-28: 1 via OPHTHALMIC
  Filled 2016-01-28: qty 0.25

## 2016-01-28 MED ORDER — ARMC OPHTHALMIC DILATING GEL
1.0000 "application " | OPHTHALMIC | Status: AC | PRN
  Administered 2016-01-28 (×2): 1 via OPHTHALMIC

## 2016-01-28 MED ORDER — CARBACHOL 0.01 % IO SOLN
INTRAOCULAR | Status: DC | PRN
  Administered 2016-01-28: .5 mL via INTRAOCULAR

## 2016-01-28 MED ORDER — ONDANSETRON HCL 4 MG/2ML IJ SOLN: 4.0000 mg | Freq: Once | INTRAMUSCULAR | Status: DC | PRN

## 2016-01-28 MED ORDER — TETRACAINE HCL 0.5 % OP SOLN
OPHTHALMIC | Status: AC
  Administered 2016-01-28: 1 [drp] via OPHTHALMIC
  Filled 2016-01-28: qty 2

## 2016-01-28 MED ORDER — FENTANYL CITRATE (PF) 100 MCG/2ML IJ SOLN: 25.0000 ug | INTRAMUSCULAR | Status: DC | PRN

## 2016-01-28 MED ORDER — POVIDONE-IODINE 5 % OP SOLN
1.0000 "application " | OPHTHALMIC | Status: AC | PRN
  Administered 2016-01-28: 1 via OPHTHALMIC

## 2016-01-28 MED ORDER — EPINEPHRINE HCL 1 MG/ML IJ SOLN
INTRAOCULAR | Status: DC | PRN
  Administered 2016-01-28: 1 mL via OPHTHALMIC

## 2016-01-28 MED ORDER — NA CHONDROIT SULF-NA HYALURON 40-17 MG/ML IO SOLN
INTRAOCULAR | Status: AC
  Filled 2016-01-28: qty 1

## 2016-01-28 MED ORDER — TETRACAINE HCL 0.5 % OP SOLN
1.0000 [drp] | OPHTHALMIC | Status: AC | PRN
  Administered 2016-01-28: 1 [drp] via OPHTHALMIC

## 2016-01-28 SURGICAL SUPPLY — 21 items
CANNULA ANT/CHMB 27GA (MISCELLANEOUS) ×3 IMPLANT
CUP MEDICINE 2OZ PLAST GRAD ST (MISCELLANEOUS) ×3 IMPLANT
GLOVE BIO SURGEON STRL SZ8 (GLOVE) ×3 IMPLANT
GLOVE BIOGEL M 6.5 STRL (GLOVE) ×3 IMPLANT
GLOVE SURG LX 8.0 MICRO (GLOVE) ×2
GLOVE SURG LX STRL 8.0 MICRO (GLOVE) ×1 IMPLANT
GOWN STRL REUS W/ TWL LRG LVL3 (GOWN DISPOSABLE) ×2 IMPLANT
GOWN STRL REUS W/TWL LRG LVL3 (GOWN DISPOSABLE) ×4
LENS IOL TECNIS ITEC 21.5 (Intraocular Lens) ×3 IMPLANT
PACK CATARACT (MISCELLANEOUS) ×3 IMPLANT
PACK CATARACT BRASINGTON LX (MISCELLANEOUS) ×3 IMPLANT
PACK EYE AFTER SURG (MISCELLANEOUS) ×3 IMPLANT
SOL BSS BAG (MISCELLANEOUS) ×3
SOL PREP PVP 2OZ (MISCELLANEOUS) ×3
SOLUTION BSS BAG (MISCELLANEOUS) ×1 IMPLANT
SOLUTION PREP PVP 2OZ (MISCELLANEOUS) ×1 IMPLANT
SYR 3ML LL SCALE MARK (SYRINGE) ×3 IMPLANT
SYR 5ML LL (SYRINGE) ×3 IMPLANT
SYR TB 1ML 27GX1/2 LL (SYRINGE) ×3 IMPLANT
WATER STERILE IRR 1000ML POUR (IV SOLUTION) ×3 IMPLANT
WIPE NON LINTING 3.25X3.25 (MISCELLANEOUS) ×3 IMPLANT

## 2016-01-28 NOTE — Anesthesia Postprocedure Evaluation (Signed)
Anesthesia Post Note  Patient: Katrina Mckinney [id number]  Procedure(s) Performed: Procedure(s) (LRB): CATARACT EXTRACTION PHACO AND INTRAOCULAR LENS PLACEMENT (IOC) (Right)  Patient location during evaluation: PACU Level of consciousness: awake and awake and alert Vital Signs Assessment: post-procedure vital signs reviewed and stable Respiratory status: spontaneous breathing Cardiovascular status: blood pressure returned to baseline and stable Postop Assessment: no headache and no backache    Last Vitals:  Filed Vitals:   01/28/16 1108  BP: 128/48  Pulse: 74  Temp: 35.8 C  Resp: 16    Last Pain:  Filed Vitals:   01/28/16 1121  PainSc: 7                  [name] C

## 2016-01-28 NOTE — Addendum Note (Signed)
Addendum  created 01/28/16 1211 by Iver Nestle, MD   Modules edited: Anesthesia Attestations

## 2016-01-28 NOTE — Anesthesia Preprocedure Evaluation (Signed)
Anesthesia Evaluation  Patient identified by MRN, date of birth, ID band Patient awake    Reviewed: Allergy & Precautions, NPO status , Patient's Chart, lab work & pertinent test results  Airway Mallampati: II       Dental  (+) Caps   Pulmonary COPD, Current Smoker,     + decreased breath sounds      Cardiovascular Exercise Tolerance: Good  Rhythm:Regular     Neuro/Psych    GI/Hepatic Neg liver ROS, GERD  Medicated,  Endo/Other  negative endocrine ROS  Renal/GU negative Renal ROS     Musculoskeletal negative musculoskeletal ROS (+)   Abdominal Normal abdominal exam  (+)   Peds negative pediatric ROS (+)  Hematology negative hematology ROS (+)   Anesthesia Other Findings   Reproductive/Obstetrics                             Anesthesia Physical Anesthesia Plan  ASA: II  Anesthesia Plan: MAC   Post-op Pain Management:    Induction: Intravenous  Airway Management Planned: Natural Airway and Nasal Cannula  Additional Equipment:   Intra-op Plan:   Post-operative Plan:   Informed Consent: I have reviewed the patients History and Physical, chart, labs and discussed the procedure including the risks, benefits and alternatives for the proposed anesthesia with the patient or authorized representative who has indicated his/her understanding and acceptance.     Plan Discussed with: CRNA  Anesthesia Plan Comments:         Anesthesia Quick Evaluation

## 2016-01-28 NOTE — Discharge Instructions (Signed)
Eye Surgery Discharge Instructions  Expect mild scratchy sensation or mild soreness. DO NOT RUB YOUR EYE!  The day of surgery:  Minimal physical activity, but bed rest is not required  No reading, computer work, or close hand work  No bending, lifting, or straining.  May watch TV  For 24 hours:  No driving, legal decisions, or alcoholic beverages  Safety precautions  Eat anything you prefer: It is better to start with liquids, then soup then solid foods.  _____ Eye patch should be worn until postoperative exam tomorrow.  ____ Solar shield eyeglasses should be worn for comfort in the sunlight/patch while sleeping  Resume all regular medications including aspirin or Coumadin if these were discontinued prior to surgery. You may shower, bathe, shave, or wash your hair. Tylenol may be taken for mild discomfort.  Call your doctor if you experience significant pain, nausea, or vomiting, fever > 101 or other signs of infection. [phone] or [phone] Specific instructions:  Follow-up Information    Follow up with Tim Lair, MD On 01/29/2016.   Specialty:  Ophthalmology   Why:  [address] information:   [address] [place] [address] [phone]

## 2016-01-28 NOTE — OR Nursing (Signed)
Patient injured right thumb yesterday.  Today it is very bruised and painful.  Patient has not seen any physician for this but we discussed the ability to take care of herself after surgery without the use of the dominant hand/thumb.  Patient will try to get a ride to see md tomorrow after follow up care.  Right thumb and wrist wrapped stationary.  Hand elevated on pillow.  Explained to patient to keep this hand elevated above level of her heart.  Ice could still help the discomfort.  Dr. George Ina aware of this as he was called in to help evaluate the appropriateness of having cataract surgery today.  Patient wanted to proceed.

## 2016-01-28 NOTE — Op Note (Signed)
PREOPERATIVE DIAGNOSIS:  Nuclear sclerotic cataract of the right eye.   POSTOPERATIVE DIAGNOSIS: nuclear sclerotic cataract right eye   OPERATIVE PROCEDURE:  Procedure(s): CATARACT EXTRACTION PHACO AND INTRAOCULAR LENS PLACEMENT (IOC)   SURGEON:  Birder Robson, MD.   ANESTHESIA:  Anesthesiologist: Iver Nestle, MD CRNA: Jennette Bill  1.      Managed anesthesia care. 2.      Topical tetracaine drops followed by 2% Xylocaine jelly applied in the preoperative holding area.   COMPLICATIONS:  None.   TECHNIQUE:   Stop and chop   DESCRIPTION OF PROCEDURE:  The patient was examined and consented in the preoperative holding area where the aforementioned topical anesthesia was applied to the right eye and then brought back to the Operating Room where the right eye was prepped and draped in the usual sterile ophthalmic fashion and a lid speculum was placed. A paracentesis was created with the side port blade and the anterior chamber was filled with viscoelastic. A near clear corneal incision was performed with the steel keratome. A continuous curvilinear capsulorrhexis was performed with a cystotome followed by the capsulorrhexis forceps. Hydrodissection and hydrodelineation were carried out with BSS on a blunt cannula. The lens was removed in a stop and chop  technique and the remaining cortical material was removed with the irrigation-aspiration handpiece. The capsular bag was inflated with viscoelastic and the Technis ZCB00  lens was placed in the capsular bag without complication. The remaining viscoelastic was removed from the eye with the irrigation-aspiration handpiece. The wounds were hydrated. The anterior chamber was flushed with Miostat and the eye was inflated to physiologic pressure. 0.1 mL of cefuroxime concentration 10 mg/mL was placed in the anterior chamber. The wounds were found to be water tight. The eye was dressed with Vigamox. The patient was given protective glasses  to wear throughout the day and a shield with which to sleep tonight. The patient was also given drops with which to begin a drop regimen today and will follow-up with me in one day.  Implant Name Type Inv. Item Serial No. Manufacturer Lot No. LRB No. Used  tecnis aspheric IOL     [id number]     Right 1   Procedure(s) with comments: CATARACT EXTRACTION PHACO AND INTRAOCULAR LENS PLACEMENT (IOC) (Right) - [place] 00:40 AP% 18.6 CDE 7.52 fluid pack lot # [id number]  Electronically signed: [place] 01/28/2016 12:06 PM

## 2016-01-28 NOTE — Transfer of Care (Signed)
Immediate Anesthesia Transfer of Care Note  Patient: Katrina Mckinney [id number]  Procedure(s) Performed: Procedure(s) with comments: CATARACT EXTRACTION PHACO AND INTRAOCULAR LENS PLACEMENT (IOC) (Right) - [place] 00:40 AP% 18.6 CDE 7.52 fluid pack lot # [id number]  Patient Location: PACU  Anesthesia Type:MAC  Level of Consciousness: awake, alert  and oriented  Airway & Oxygen Therapy: Patient Spontanous Breathing  Post-op Assessment: Report given to RN and Post -op Vital signs reviewed and stable  Post vital signs: Reviewed and stable  Last Vitals:  Filed Vitals:   01/28/16 1108  BP: 128/48  Pulse: 74  Temp: 35.8 C  Resp: 16    Last Pain:  Filed Vitals:   01/28/16 1121  PainSc: 7       Patients Stated Pain Goal: 2 ([phone])  Complications: No apparent anesthesia complications

## 2016-05-08 DIAGNOSIS — F1190 Opioid use, unspecified, uncomplicated: Secondary | ICD-10-CM | POA: Insufficient documentation

## 2016-09-03 ENCOUNTER — Other Ambulatory Visit: Payer: Self-pay | Admitting: Nurse Practitioner

## 2016-09-03 DIAGNOSIS — G8929 Other chronic pain: Secondary | ICD-10-CM

## 2016-09-03 DIAGNOSIS — R109 Unspecified abdominal pain: Principal | ICD-10-CM

## 2016-09-15 ENCOUNTER — Ambulatory Visit
Admission: RE | Admit: 2016-09-15 | Discharge: 2016-09-15 | Disposition: A | Discharge status: Home / Self Care | Payer: PPO | Source: Ambulatory Visit | Attending: Nurse Practitioner | Admitting: Nurse Practitioner

## 2016-09-15 ENCOUNTER — Other Ambulatory Visit: Payer: Self-pay | Admitting: Infectious Diseases

## 2016-09-15 DIAGNOSIS — G8929 Other chronic pain: Secondary | ICD-10-CM

## 2016-09-15 DIAGNOSIS — K22719 Barrett's esophagus with dysplasia, unspecified: Secondary | ICD-10-CM | POA: Diagnosis not present

## 2016-09-15 DIAGNOSIS — R911 Solitary pulmonary nodule: Secondary | ICD-10-CM | POA: Insufficient documentation

## 2016-09-15 DIAGNOSIS — R634 Abnormal weight loss: Secondary | ICD-10-CM | POA: Diagnosis not present

## 2016-09-15 DIAGNOSIS — I77811 Abdominal aortic ectasia: Secondary | ICD-10-CM | POA: Insufficient documentation

## 2016-09-15 DIAGNOSIS — F1190 Opioid use, unspecified, uncomplicated: Secondary | ICD-10-CM | POA: Diagnosis not present

## 2016-09-15 DIAGNOSIS — F329 Major depressive disorder, single episode, unspecified: Secondary | ICD-10-CM | POA: Diagnosis not present

## 2016-09-15 DIAGNOSIS — R109 Unspecified abdominal pain: Principal | ICD-10-CM

## 2016-09-15 DIAGNOSIS — K047 Periapical abscess without sinus: Secondary | ICD-10-CM | POA: Diagnosis not present

## 2016-09-15 DIAGNOSIS — E785 Hyperlipidemia, unspecified: Secondary | ICD-10-CM | POA: Diagnosis not present

## 2016-09-15 DIAGNOSIS — R1010 Upper abdominal pain, unspecified: Secondary | ICD-10-CM | POA: Diagnosis not present

## 2016-09-15 MED ORDER — TECHNETIUM TC 99M MEBROFENIN IV KIT
5.0000 | PACK | Freq: Once | INTRAVENOUS | Status: AC | PRN
  Administered 2016-09-15: 5.32 via INTRAVENOUS

## 2016-10-02 ENCOUNTER — Ambulatory Visit
Admission: RE | Admit: 2016-10-02 | Discharge: 2016-10-02 | Disposition: A | Discharge status: Home / Self Care | Payer: PPO | Source: Ambulatory Visit | Attending: Infectious Diseases | Admitting: Infectious Diseases

## 2016-10-02 DIAGNOSIS — I700 Atherosclerosis of aorta: Secondary | ICD-10-CM | POA: Insufficient documentation

## 2016-10-02 DIAGNOSIS — R911 Solitary pulmonary nodule: Secondary | ICD-10-CM | POA: Diagnosis not present

## 2016-10-02 DIAGNOSIS — J948 Other specified pleural conditions: Secondary | ICD-10-CM | POA: Insufficient documentation

## 2016-10-02 DIAGNOSIS — I2510 Atherosclerotic heart disease of native coronary artery without angina pectoris: Secondary | ICD-10-CM | POA: Insufficient documentation

## 2016-10-12 DIAGNOSIS — R0602 Shortness of breath: Secondary | ICD-10-CM | POA: Diagnosis not present

## 2016-10-12 DIAGNOSIS — J310 Chronic rhinitis: Secondary | ICD-10-CM | POA: Diagnosis not present

## 2016-10-12 DIAGNOSIS — J439 Emphysema, unspecified: Secondary | ICD-10-CM | POA: Diagnosis not present

## 2016-10-12 DIAGNOSIS — R911 Solitary pulmonary nodule: Secondary | ICD-10-CM | POA: Diagnosis not present

## 2016-10-13 ENCOUNTER — Ambulatory Visit: Payer: Medicare Other

## 2016-10-19 ENCOUNTER — Other Ambulatory Visit: Payer: Self-pay

## 2016-10-19 DIAGNOSIS — K2270 Barrett's esophagus without dysplasia: Secondary | ICD-10-CM | POA: Insufficient documentation

## 2016-10-19 DIAGNOSIS — E785 Hyperlipidemia, unspecified: Secondary | ICD-10-CM | POA: Insufficient documentation

## 2016-10-19 DIAGNOSIS — M1990 Unspecified osteoarthritis, unspecified site: Secondary | ICD-10-CM | POA: Insufficient documentation

## 2016-10-19 DIAGNOSIS — K219 Gastro-esophageal reflux disease without esophagitis: Secondary | ICD-10-CM | POA: Insufficient documentation

## 2016-10-23 ENCOUNTER — Encounter: Payer: Self-pay | Admitting: Cardiothoracic Surgery

## 2016-10-23 ENCOUNTER — Other Ambulatory Visit: Payer: Self-pay

## 2016-10-23 ENCOUNTER — Ambulatory Visit (INDEPENDENT_AMBULATORY_CARE_PROVIDER_SITE_OTHER): Payer: PPO | Admitting: Cardiothoracic Surgery

## 2016-10-23 VITALS — BP 146/69 | HR 86 | Temp 98.6°F | Resp 20 | Ht 65.0 in | Wt 169.0 lb

## 2016-10-23 DIAGNOSIS — R911 Solitary pulmonary nodule: Secondary | ICD-10-CM | POA: Diagnosis not present

## 2016-10-23 NOTE — Patient Instructions (Signed)
We will obtain a CT of your Chest in 6 months and Follow-up in the Office with Dr. Genevive Bi afterwards. I will call you as soon as it is time to make these appointments.  Please call with any questions or concerns prior to this time.

## 2016-10-23 NOTE — Progress Notes (Signed)
Patient ID: Katrina Mckinney, female   DOB: [birth year], 81 y.o.   MRN: [id number]  Chief Complaint  Patient presents with  . New Patient (Initial Visit)    Right Upper Lobe Nodule    Referred By Dr. Raul Del Reason for Referral right lower lobe nodule  HPI Location, Quality, Duration, Severity, Timing, Context, Modifying Factors, Associated Signs and Symptoms.  Katrina Mckinney is a 81 y.o. female.  This patient is a 81 year old female who about a year and a half ago had a chest CT scan made for screening purposes. At that time a 4 mm nodule is identified in the right lower lobe. A subsequent CT scan and follow-up performed last month revealed that the 4 mm nodule had increased to approximately 8 mm. It is in the midportion of the right lower lobe and would be very difficult to feel at the time of surgical resection. The patient states that she does smoke and has smoked off and on at least a half pack cigarettes a day totaling a bout 35 pack years. She does not get short of breath but she is limited secondary to arthritic changes in her knees and back. She denied any fevers chills or hemoptysis. She does have active ulcer disease and had some weight loss secondary to that but this has subsequently been regained and she is now back to her baseline.   Past Medical History:  Diagnosis Date  . Anxiety   . Arthritis    hands  . Atrophic vaginitis   . Barrett's esophagus   . Depression   . Frequency   . Gastric ulcer   . GERD (gastroesophageal reflux disease)   . Hyperlipidemia   . Incomplete bladder emptying   . Low back pain   . Nocturia   . Pneumonia 2013   in past.  . Urethral caruncle   . Urethral stricture   . Urethral syndrome     Past Surgical History:  Procedure Laterality Date  . ABDOMINAL HYSTERECTOMY    . BLADDER SURGERY    . CATARACT EXTRACTION W/PHACO Left 12/03/2015   Procedure: CATARACT EXTRACTION PHACO AND INTRAOCULAR LENS PLACEMENT (IOC);  Surgeon: Birder Robson, MD;  Location: [facility] ORS;  Service: Ophthalmology;  Laterality: Left;  [place] 44.3 AP% 23.3 CDE 10.31 Fluid Pack Lot # [id number]  . CATARACT EXTRACTION W/PHACO Right 01/28/2016   Procedure: CATARACT EXTRACTION PHACO AND INTRAOCULAR LENS PLACEMENT ([place]);  Surgeon: Birder Robson, MD;  Location: [facility] ORS;  Service: Ophthalmology;  Laterality: Right;  [place] 00:40 AP% 18.6 CDE 7.52 fluid pack lot # [id number]  . COLONOSCOPY    . COLONOSCOPY WITH PROPOFOL N/A 02/14/2015   Procedure: COLONOSCOPY WITH PROPOFOL;  Surgeon: Manya Silvas, MD;  Location[facility];  Service: Endoscopy;  Laterality: N/A;  . DILATION AND CURETTAGE OF UTERUS    . ESOPHAGOGASTRODUODENOSCOPY N/A 02/14/2015   Procedure: ESOPHAGOGASTRODUODENOSCOPY (EGD);  Surgeon: Manya Silvas, MD;  Location: [facility] ENDOSCOPY;  Service: Endoscopy;  Laterality: N/A;  . NASAL SINUS SURGERY     x 3  . REDUCTION MAMMAPLASTY      Family History  Problem Relation Age of Onset  . Alzheimer's disease Mother   . Alzheimer's disease Father   . Alzheimer's disease Sister   . Alzheimer's disease Brother   . Kidney disease Neg Hx   . Bladder Cancer Neg Hx   . Lung cancer Neg Hx     Social History Social History  Substance Use Topics  . Smoking status: Current  Every Day Smoker    Packs/day: 0.50    Years: 35.00    Types: Cigarettes  . Smokeless tobacco: Never Used  . Alcohol use No    Allergies  Allergen Reactions  . Aspirin Other (See Comments)    Peptic Ulcer  . Penicillin G Nausea And Vomiting, Swelling and Rash  . Celecoxib Other (See Comments)    Peptic Ulcer    Current Outpatient Prescriptions  Medication Sig Dispense Refill  . cetirizine (ZYRTEC) 10 MG tablet Take 1 tablet by mouth 1 day or 1 dose.    . citalopram (CELEXA) 10 MG tablet TAKE 2 TABLET (20 MG TOTAL) BY MOUTH ONCE DAILY in am.  11  . fluticasone (FLONASE) 50 MCG/ACT nasal spray Place 1 spray into both nostrils as needed for allergies or rhinitis.     Marland Kitchen HYDROcodone-acetaminophen (NORCO/VICODIN) 5-325 MG per tablet Take 1 tablet by mouth every 6 (six) hours as needed for moderate pain.    . mirtazapine (REMERON) 15 MG tablet Take 15 mg by mouth at bedtime.     Marland Kitchen omeprazole (PRILOSEC) 20 MG capsule Take 20 mg by mouth daily.    . sucralfate (CARAFATE) 1 g tablet Take 1 tablet by mouth 1 day or 1 dose.    . traMADol (ULTRAM) 50 MG tablet Take 50 mg by mouth every 6 (six) hours as needed.     No current facility-administered medications for this visit.       Review of Systems A complete review of systems was asked and was negative except for the following positive findingsFrequent cough, wheezing, heartburn, joint pain, headache.  Blood pressure (!) 146/69, pulse 86, temperature 98.6 F (37 C), temperature source Oral, resp. rate 20, height 5\' 5"  (1.651 m), weight 169 lb (76.7 kg), SpO2 93 %.  Physical Exam CONSTITUTIONAL:  Pleasant, well-developed, well-nourished, and in no acute distress. EYES: Pupils equal and reactive to light, Sclera non-icteric EARS, NOSE, MOUTH AND THROAT:  The oropharynx was clear.  Dentition is good repair.  Oral mucosa pink and moist. LYMPH NODES:  Lymph nodes in the neck and axillae were normal RESPIRATORY:  Lungs were clear.  Normal respiratory effort without pathologic use of accessory muscles of respiration CARDIOVASCULAR: Heart was regular without murmurs.  There were no carotid bruits. GI: The abdomen was soft, nontender, and nondistended. There were no palpable masses. There was no hepatosplenomegaly. There were normal bowel sounds in all quadrants. GU:  Rectal deferred.   MUSCULOSKELETAL:  Normal muscle strength and tone.  No clubbing or cyanosis.   SKIN:  There were no pathologic skin lesions.  There were no nodules on palpation. NEUROLOGIC:  Sensation is normal.  Cranial nerves are grossly intact. PSYCH:  Oriented to person, place and time.  Mood and affect are normal.  Data Reviewed CT scan from  2016 and 2018.  I have personally reviewed the patient's imaging, laboratory findings and medical records.    Assessment    There is a right lower lobe nodule. It's unclear the exact etiology. I do believe that it's too small to currently biopsy percutaneously or bronchoscopically. I reviewed this with the patient. I think that we should get another CT scan in about 6 months time. She is agreeable to doing that.    Plan    I would recommend a CT scan the chest in 6 months without contrast. She would like to have that performed. We will go ahead and set her up for that.  Nestor Lewandowsky, MD 10/23/2016, 10:03 AM

## 2016-11-10 DIAGNOSIS — R911 Solitary pulmonary nodule: Secondary | ICD-10-CM | POA: Diagnosis not present

## 2016-11-10 DIAGNOSIS — J439 Emphysema, unspecified: Secondary | ICD-10-CM | POA: Diagnosis not present

## 2016-11-10 DIAGNOSIS — R0609 Other forms of dyspnea: Secondary | ICD-10-CM | POA: Diagnosis not present

## 2016-11-11 DIAGNOSIS — Z1159 Encounter for screening for other viral diseases: Secondary | ICD-10-CM | POA: Diagnosis not present

## 2016-11-11 DIAGNOSIS — F1190 Opioid use, unspecified, uncomplicated: Secondary | ICD-10-CM | POA: Diagnosis not present

## 2016-11-11 DIAGNOSIS — E785 Hyperlipidemia, unspecified: Secondary | ICD-10-CM | POA: Diagnosis not present

## 2016-11-11 DIAGNOSIS — F329 Major depressive disorder, single episode, unspecified: Secondary | ICD-10-CM | POA: Diagnosis not present

## 2016-11-11 DIAGNOSIS — M1990 Unspecified osteoarthritis, unspecified site: Secondary | ICD-10-CM | POA: Diagnosis not present

## 2016-11-11 DIAGNOSIS — R634 Abnormal weight loss: Secondary | ICD-10-CM | POA: Diagnosis not present

## 2016-11-11 DIAGNOSIS — M546 Pain in thoracic spine: Secondary | ICD-10-CM | POA: Diagnosis not present

## 2016-11-11 DIAGNOSIS — K210 Gastro-esophageal reflux disease with esophagitis: Secondary | ICD-10-CM | POA: Diagnosis not present

## 2016-11-17 DIAGNOSIS — M170 Bilateral primary osteoarthritis of knee: Secondary | ICD-10-CM | POA: Diagnosis not present

## 2016-11-17 DIAGNOSIS — M25562 Pain in left knee: Secondary | ICD-10-CM | POA: Diagnosis not present

## 2016-11-17 DIAGNOSIS — M25561 Pain in right knee: Secondary | ICD-10-CM | POA: Diagnosis not present

## 2016-11-19 ENCOUNTER — Other Ambulatory Visit: Payer: Self-pay | Admitting: Nurse Practitioner

## 2016-11-19 DIAGNOSIS — R197 Diarrhea, unspecified: Secondary | ICD-10-CM | POA: Insufficient documentation

## 2016-11-19 DIAGNOSIS — R109 Unspecified abdominal pain: Secondary | ICD-10-CM | POA: Diagnosis not present

## 2016-11-19 DIAGNOSIS — G8929 Other chronic pain: Secondary | ICD-10-CM

## 2016-11-19 DIAGNOSIS — K210 Gastro-esophageal reflux disease with esophagitis: Secondary | ICD-10-CM | POA: Diagnosis not present

## 2016-11-23 ENCOUNTER — Ambulatory Visit
Admission: RE | Admit: 2016-11-23 | Discharge: 2016-11-23 | Disposition: A | Discharge status: Home / Self Care | Payer: PPO | Source: Ambulatory Visit | Attending: Nurse Practitioner | Admitting: Nurse Practitioner

## 2016-11-23 DIAGNOSIS — R1013 Epigastric pain: Secondary | ICD-10-CM | POA: Insufficient documentation

## 2016-11-23 DIAGNOSIS — K760 Fatty (change of) liver, not elsewhere classified: Secondary | ICD-10-CM | POA: Diagnosis not present

## 2016-11-23 DIAGNOSIS — Z90710 Acquired absence of both cervix and uterus: Secondary | ICD-10-CM | POA: Diagnosis not present

## 2016-11-23 DIAGNOSIS — M47896 Other spondylosis, lumbar region: Secondary | ICD-10-CM | POA: Diagnosis not present

## 2016-11-23 DIAGNOSIS — R109 Unspecified abdominal pain: Secondary | ICD-10-CM

## 2016-11-23 DIAGNOSIS — R197 Diarrhea, unspecified: Secondary | ICD-10-CM | POA: Diagnosis not present

## 2016-11-23 DIAGNOSIS — R1110 Vomiting, unspecified: Secondary | ICD-10-CM | POA: Diagnosis not present

## 2016-11-23 DIAGNOSIS — G8929 Other chronic pain: Secondary | ICD-10-CM

## 2016-11-23 MED ORDER — IOPAMIDOL (ISOVUE-300) INJECTION 61%: 100.0000 mL | Freq: Once | INTRAVENOUS | Status: DC | PRN

## 2016-11-24 DIAGNOSIS — M170 Bilateral primary osteoarthritis of knee: Secondary | ICD-10-CM | POA: Diagnosis not present

## 2016-11-24 DIAGNOSIS — M25561 Pain in right knee: Secondary | ICD-10-CM | POA: Diagnosis not present

## 2016-11-24 DIAGNOSIS — M25562 Pain in left knee: Secondary | ICD-10-CM | POA: Diagnosis not present

## 2016-11-27 DIAGNOSIS — I700 Atherosclerosis of aorta: Secondary | ICD-10-CM | POA: Insufficient documentation

## 2016-12-01 DIAGNOSIS — M170 Bilateral primary osteoarthritis of knee: Secondary | ICD-10-CM | POA: Diagnosis not present

## 2016-12-01 DIAGNOSIS — M25561 Pain in right knee: Secondary | ICD-10-CM | POA: Diagnosis not present

## 2016-12-01 DIAGNOSIS — M25562 Pain in left knee: Secondary | ICD-10-CM | POA: Diagnosis not present

## 2016-12-08 DIAGNOSIS — M25562 Pain in left knee: Secondary | ICD-10-CM | POA: Diagnosis not present

## 2016-12-08 DIAGNOSIS — M170 Bilateral primary osteoarthritis of knee: Secondary | ICD-10-CM | POA: Diagnosis not present

## 2016-12-08 DIAGNOSIS — M25561 Pain in right knee: Secondary | ICD-10-CM | POA: Diagnosis not present

## 2016-12-29 DIAGNOSIS — M25562 Pain in left knee: Secondary | ICD-10-CM | POA: Diagnosis not present

## 2016-12-29 DIAGNOSIS — M25561 Pain in right knee: Secondary | ICD-10-CM | POA: Diagnosis not present

## 2016-12-29 DIAGNOSIS — M170 Bilateral primary osteoarthritis of knee: Secondary | ICD-10-CM | POA: Diagnosis not present

## 2017-01-04 DIAGNOSIS — H6122 Impacted cerumen, left ear: Secondary | ICD-10-CM | POA: Diagnosis not present

## 2017-01-26 ENCOUNTER — Other Ambulatory Visit: Payer: Self-pay | Admitting: *Deleted

## 2017-01-26 NOTE — Patient Outreach (Signed)
[facility] Screening call, unsuccessful but left a message for a return call. If I do not hear back from the member I will try again within the week.  Simon Llamas C. Kavya Haag, MSN, GNP-BC Gerontological Nurse Practitioner [facility] [phone]  

## 2017-02-15 DIAGNOSIS — E785 Hyperlipidemia, unspecified: Secondary | ICD-10-CM | POA: Diagnosis not present

## 2017-02-15 DIAGNOSIS — F1190 Opioid use, unspecified, uncomplicated: Secondary | ICD-10-CM | POA: Diagnosis not present

## 2017-02-15 DIAGNOSIS — K210 Gastro-esophageal reflux disease with esophagitis: Secondary | ICD-10-CM | POA: Diagnosis not present

## 2017-02-15 DIAGNOSIS — M1990 Unspecified osteoarthritis, unspecified site: Secondary | ICD-10-CM | POA: Diagnosis not present

## 2017-02-15 DIAGNOSIS — K22719 Barrett's esophagus with dysplasia, unspecified: Secondary | ICD-10-CM | POA: Diagnosis not present

## 2017-03-14 IMAGING — CT CT CHEST W/ CM
2 of 3 series · 15 of 36 positions shown, 18 images · IV contrast (omnipaque)
Comparison: 09/06/2013

CLINICAL DATA: Upper and mid back pain. Symptoms for 1 month.
Weight loss. Night sweats.

EXAM:
CT CHEST WITH CONTRAST
TECHNIQUE: Multidetector CT imaging of the chest was performed during
intravenous contrast administration.
CONTRAST:  75mL OMNIPAQUE IOHEXOL 300 MG/ML  SOLN

[Series 3: routine chest with · axial · 0.69mm/px · z∈[-505,-225]mm · 12 of 66 slices shown, 15 images]
[im 5/66  mediastinal]
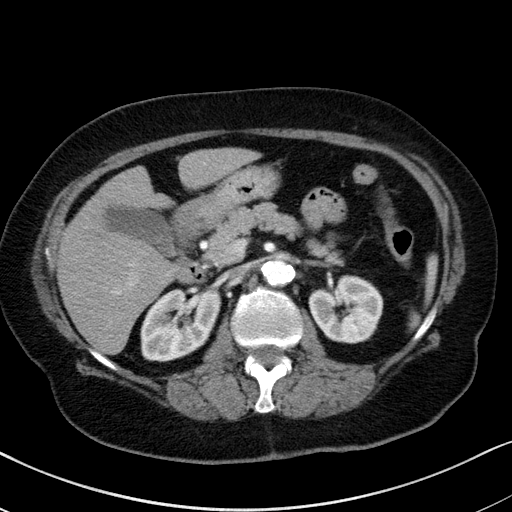
[im 5/66  lung]
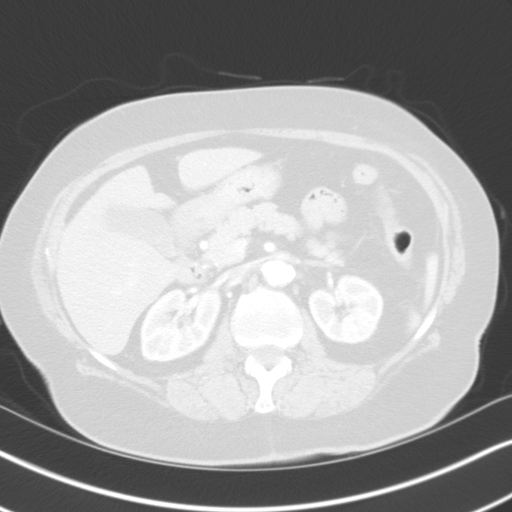
[im 10/66  lung]
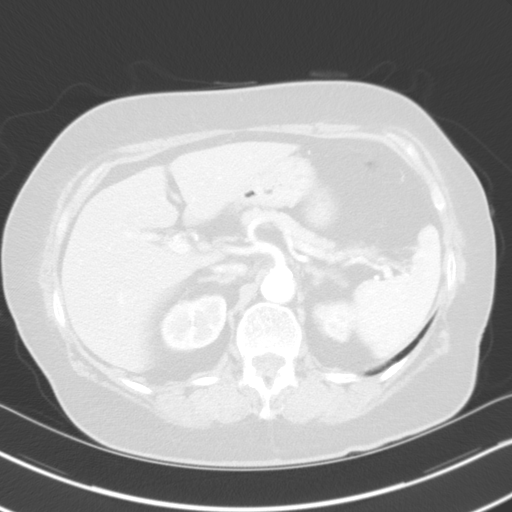
[im 15/66  lung]
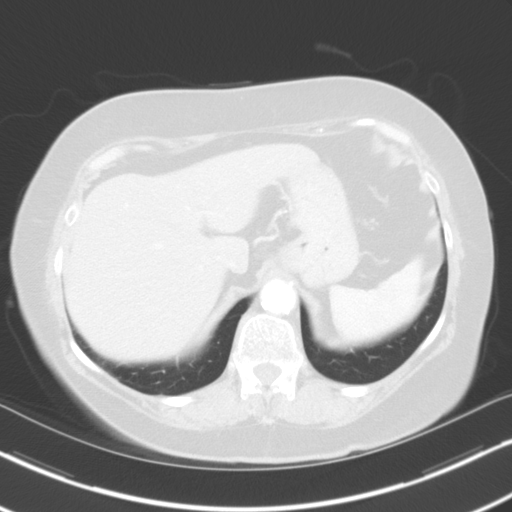
[im 20/66  lung]
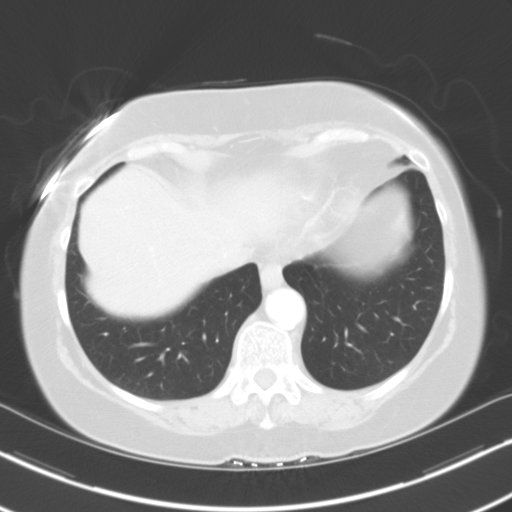
[im 25/66  mediastinal]
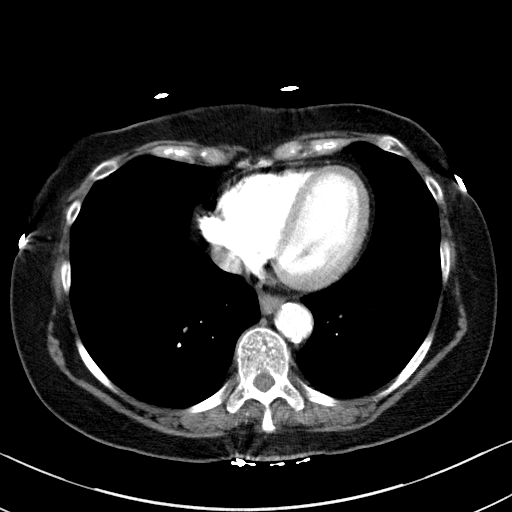
[im 25/66  lung]
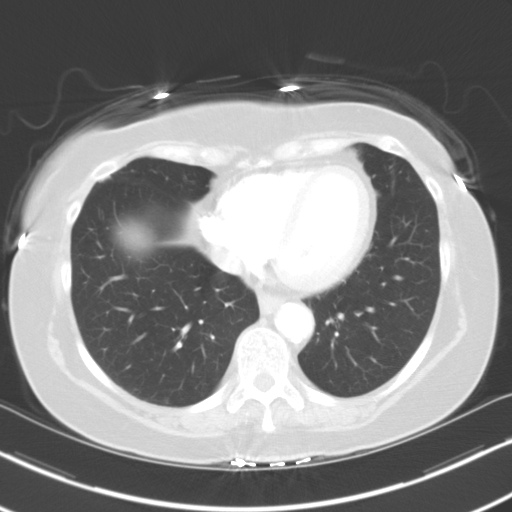
[im 29/66  lung]
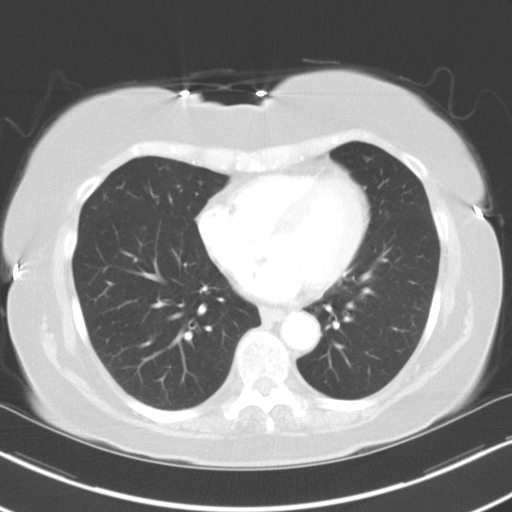
[im 37/66  lung]
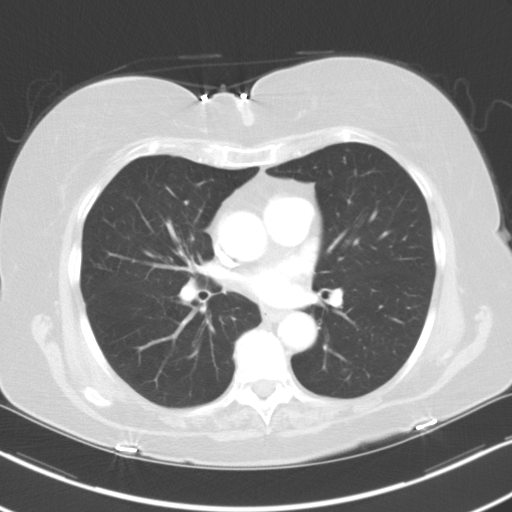
[im 41/66  lung]
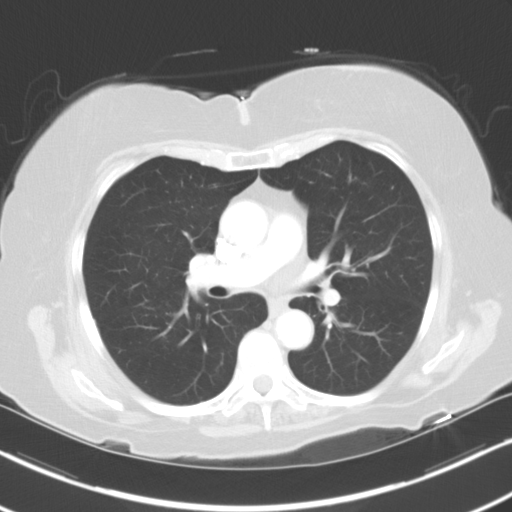
[im 46/66  mediastinal]
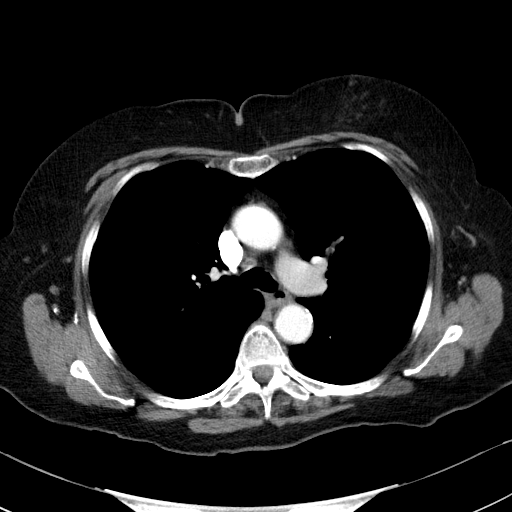
[im 46/66  lung]
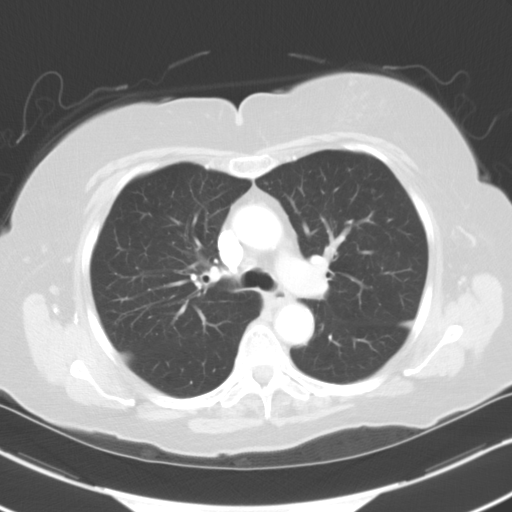
[im 51/66  lung]
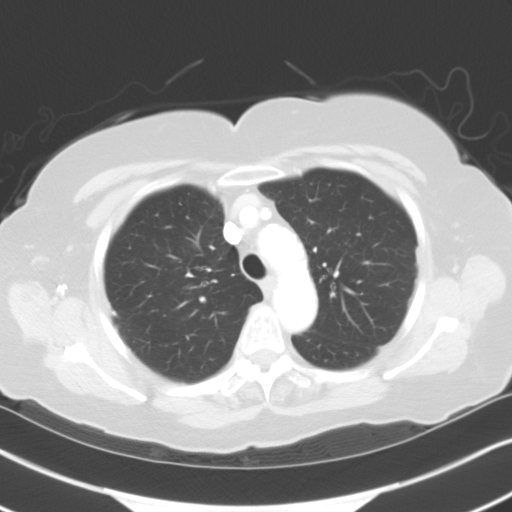
[im 56/66  lung]
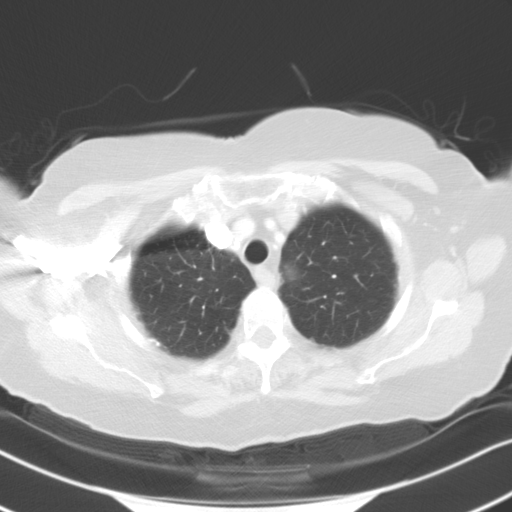
[im 61/66  lung]
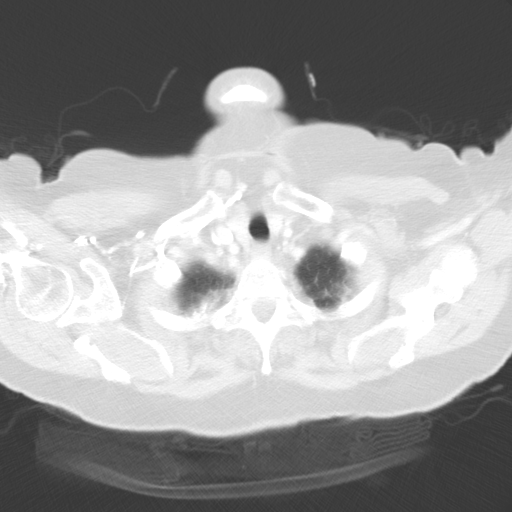

[Series 6: cor routine chest with · coronal · 0.66mm/px · 3 of 144 slices shown]
[im 29/144  lung]
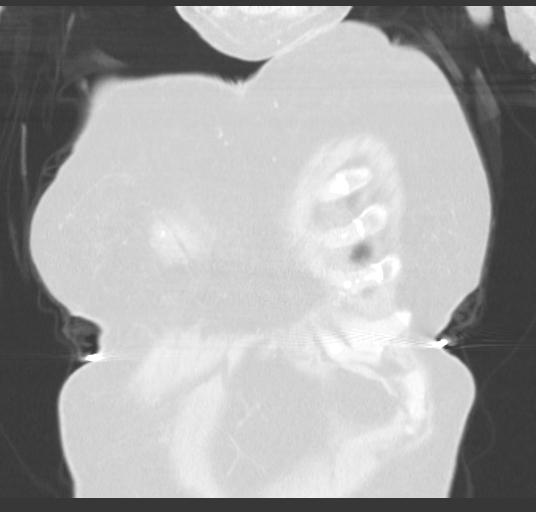
[im 58/144  lung]
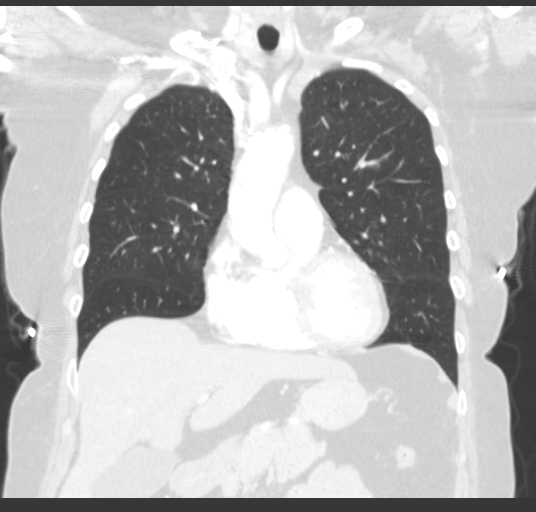
[im 86/144  lung]
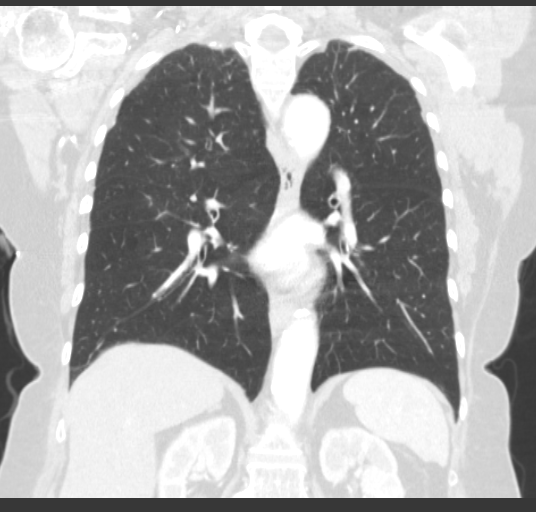

[15 of 36 positions shown; findings below may reference images not displayed]

FINDINGS: Mediastinum/Nodes: Aortic and branch vessel atherosclerosis. Heart
borderline enlarged, without pericardial effusion. Multivessel
coronary artery atherosclerosis. No central pulmonary embolism, on
this non-dedicated study. No mediastinal or hilar adenopathy.

Lungs/Pleura: No pleural fluid. Minimal bilateral pleural thickening
with calcification, similar. Mild centrilobular emphysema. Right
lower lobe 4 mm nodule (image 27, series 4), not readily apparent on
the prior exam.

Upper abdomen: Normal imaged portions of the liver, spleen, stomach,
pancreas, gallbladder, biliary tract, adrenal glands, kidneys.

Musculoskeletal: A mild pectus excavatum deformity. Mid thoracic
spondylosis. No acute osseous abnormality.
IMPRESSION: 1.  No acute process in the chest.
2. Mild centrilobular emphysema. 4 mm right lower lobe pulmonary
nodule. Given risk factors for bronchogenic carcinoma, follow-up
chest CT at 1 year is recommended. This recommendation follows the
consensus statement: "Guidelines for Management of Small Pulmonary
Nodules Detected on CT Scans: A Statement from the [employer]
online at: [URL]
3.  Atherosclerosis, including within the coronary arteries.
4. Minimal bilateral pleural thickening and calcification,
suggesting asbestos related pleural disease.

## 2017-03-26 ENCOUNTER — Telehealth: Payer: Self-pay

## 2017-03-26 NOTE — Telephone Encounter (Signed)
CT Scan scheduled for 04/01/17 at 0900. Arrive at [employer] [employer].  Follow-up appointment with Dr. Genevive Bi has been made.  Call made to patient. All appointment information and instructions have been given and patient did read back correctly. Encouraged to call with any questions or concerns prior to her scheduled appointment. She verbalizes understanding.

## 2017-03-31 ENCOUNTER — Telehealth: Payer: Self-pay

## 2017-03-31 NOTE — Telephone Encounter (Signed)
[employer] prior authorization form faxed at this time.  CPT code [phone] ICD 10- R91.1 Received confirmation.

## 2017-04-01 ENCOUNTER — Ambulatory Visit
Admission: RE | Admit: 2017-04-01 | Discharge: 2017-04-01 | Disposition: A | Discharge status: Home / Self Care | Payer: PPO | Source: Ambulatory Visit | Attending: Cardiothoracic Surgery | Admitting: Cardiothoracic Surgery

## 2017-04-01 DIAGNOSIS — I700 Atherosclerosis of aorta: Secondary | ICD-10-CM | POA: Diagnosis not present

## 2017-04-01 DIAGNOSIS — R911 Solitary pulmonary nodule: Secondary | ICD-10-CM | POA: Insufficient documentation

## 2017-04-01 DIAGNOSIS — J432 Centrilobular emphysema: Secondary | ICD-10-CM | POA: Insufficient documentation

## 2017-04-01 DIAGNOSIS — I2510 Atherosclerotic heart disease of native coronary artery without angina pectoris: Secondary | ICD-10-CM | POA: Insufficient documentation

## 2017-04-01 HISTORY — DX: Systemic involvement of connective tissue, unspecified: M35.9

## 2017-04-01 LAB — POCT I-STAT CREATININE: Creatinine, Ser: 1.4 mg/dL — ABNORMAL HIGH (ref 0.44–1.00)

## 2017-04-01 MED ORDER — IOPAMIDOL (ISOVUE-300) INJECTION 61%
75.0000 mL | Freq: Once | INTRAVENOUS | Status: AC | PRN
  Administered 2017-04-01: 75 mL via INTRAVENOUS

## 2017-04-05 ENCOUNTER — Ambulatory Visit (INDEPENDENT_AMBULATORY_CARE_PROVIDER_SITE_OTHER): Payer: PPO | Admitting: Cardiothoracic Surgery

## 2017-04-05 ENCOUNTER — Encounter: Payer: Self-pay | Admitting: Cardiothoracic Surgery

## 2017-04-05 VITALS — BP 127/71 | HR 80 | Temp 98.2°F | Ht 65.0 in | Wt 166.2 lb

## 2017-04-05 DIAGNOSIS — R911 Solitary pulmonary nodule: Secondary | ICD-10-CM | POA: Diagnosis not present

## 2017-04-05 NOTE — Patient Instructions (Addendum)
Make sure that you follow-up in 1 year with Dr. Ola Spurr with a screening CT before your appointment. You should be eligible for screening CT's through the cancer center. Speak with Dr. Blane Ohara office about this.  There is no need for further follow-up with me unless your symptoms change or something new is found on your CT Scan in 1 year.

## 2017-04-05 NOTE — Progress Notes (Signed)
  Patient ID: Katrina Mckinney, female   DOB: 05/03/1936, 81 y.o.   MRN: [id number]  HISTORY: She returns today in follow-up. She did have a repeat CT scan performed. Of independently reviewed that and discussed those results with her. She continues to have a multitude of complaints regarding her reflux and abdominal pain. She states that she feels like she occasionally has some dysphasia and also some mid epigastric pain. She does not complain of any shortness of breath. She does continue to smoke however.   Vitals:   04/05/17 0816  BP: 127/71  Pulse: 80  Temp: 98.2 F (36.8 C)     EXAM:    Resp: Lungs are clear bilaterally.  No respiratory distress, normal effort. Heart:  Regular without murmurs Abd:  Abdomen is soft, non distended and tender to palpation in the midepigastrium.. No masses are palpable.  There is no rebound and no guarding.  Neurological: Alert and oriented to person, place, and time. Coordination normal.  Skin: Skin is warm and dry. No rash noted. No diaphoretic. No erythema. No pallor.  Psychiatric: Normal mood and affect. Normal behavior. Judgment and thought content normal.    ASSESSMENT: I have independently reviewed the patient's CT scan. The pulmonary nodule which have been present in the right lower lobe has resolved. There are no new lesions present.   PLAN:   I told the patient that there is no evidence of malignancy at this time. She should continue her follow-up with her primary care physician. Because of her age she may be a candidate for continued surveillance and I would discuss that with her in the future. I did not make a return visit for today but would be happy to see her should the need arise.    Nestor Lewandowsky, MD

## 2017-05-12 DIAGNOSIS — F1190 Opioid use, unspecified, uncomplicated: Secondary | ICD-10-CM | POA: Diagnosis not present

## 2017-05-12 DIAGNOSIS — E785 Hyperlipidemia, unspecified: Secondary | ICD-10-CM | POA: Diagnosis not present

## 2017-05-12 DIAGNOSIS — K210 Gastro-esophageal reflux disease with esophagitis: Secondary | ICD-10-CM | POA: Diagnosis not present

## 2017-05-18 DIAGNOSIS — M546 Pain in thoracic spine: Secondary | ICD-10-CM | POA: Diagnosis not present

## 2017-05-18 DIAGNOSIS — F329 Major depressive disorder, single episode, unspecified: Secondary | ICD-10-CM | POA: Diagnosis not present

## 2017-05-18 DIAGNOSIS — E785 Hyperlipidemia, unspecified: Secondary | ICD-10-CM | POA: Diagnosis not present

## 2017-05-18 DIAGNOSIS — M1990 Unspecified osteoarthritis, unspecified site: Secondary | ICD-10-CM | POA: Diagnosis not present

## 2017-05-18 DIAGNOSIS — F1190 Opioid use, unspecified, uncomplicated: Secondary | ICD-10-CM | POA: Diagnosis not present

## 2017-05-18 DIAGNOSIS — Z0000 Encounter for general adult medical examination without abnormal findings: Secondary | ICD-10-CM | POA: Diagnosis not present

## 2017-06-21 ENCOUNTER — Other Ambulatory Visit: Payer: Self-pay | Admitting: Physical Medicine and Rehabilitation

## 2017-06-21 DIAGNOSIS — M5136 Other intervertebral disc degeneration, lumbar region: Secondary | ICD-10-CM | POA: Diagnosis not present

## 2017-06-21 DIAGNOSIS — M5414 Radiculopathy, thoracic region: Secondary | ICD-10-CM | POA: Diagnosis not present

## 2017-06-21 DIAGNOSIS — M5134 Other intervertebral disc degeneration, thoracic region: Secondary | ICD-10-CM

## 2017-06-21 DIAGNOSIS — M5416 Radiculopathy, lumbar region: Secondary | ICD-10-CM

## 2017-06-29 ENCOUNTER — Ambulatory Visit
Admission: RE | Admit: 2017-06-29 | Discharge: 2017-06-29 | Disposition: A | Discharge status: Home / Self Care | Payer: PPO | Source: Ambulatory Visit | Attending: Physical Medicine and Rehabilitation | Admitting: Physical Medicine and Rehabilitation

## 2017-06-29 ENCOUNTER — Ambulatory Visit: Payer: PPO

## 2017-07-13 DIAGNOSIS — K22719 Barrett's esophagus with dysplasia, unspecified: Secondary | ICD-10-CM | POA: Diagnosis not present

## 2017-07-13 DIAGNOSIS — R159 Full incontinence of feces: Secondary | ICD-10-CM | POA: Diagnosis not present

## 2017-07-13 DIAGNOSIS — R109 Unspecified abdominal pain: Secondary | ICD-10-CM | POA: Diagnosis not present

## 2017-07-13 DIAGNOSIS — G8929 Other chronic pain: Secondary | ICD-10-CM | POA: Diagnosis not present

## 2017-07-13 DIAGNOSIS — K210 Gastro-esophageal reflux disease with esophagitis: Secondary | ICD-10-CM | POA: Diagnosis not present

## 2017-12-20 ENCOUNTER — Other Ambulatory Visit: Payer: Self-pay | Admitting: Nurse Practitioner

## 2017-12-20 DIAGNOSIS — G8929 Other chronic pain: Secondary | ICD-10-CM

## 2017-12-20 DIAGNOSIS — K760 Fatty (change of) liver, not elsewhere classified: Secondary | ICD-10-CM

## 2017-12-20 DIAGNOSIS — R1011 Right upper quadrant pain: Secondary | ICD-10-CM

## 2017-12-20 DIAGNOSIS — R109 Unspecified abdominal pain: Principal | ICD-10-CM

## 2017-12-24 ENCOUNTER — Ambulatory Visit
Admission: RE | Admit: 2017-12-24 | Discharge: 2017-12-24 | Disposition: A | Discharge status: Home / Self Care | Payer: Medicare Other | Source: Ambulatory Visit | Attending: Nurse Practitioner | Admitting: Nurse Practitioner

## 2017-12-24 DIAGNOSIS — R109 Unspecified abdominal pain: Secondary | ICD-10-CM

## 2017-12-24 DIAGNOSIS — R1011 Right upper quadrant pain: Secondary | ICD-10-CM | POA: Insufficient documentation

## 2017-12-24 DIAGNOSIS — G8929 Other chronic pain: Secondary | ICD-10-CM | POA: Insufficient documentation

## 2017-12-24 DIAGNOSIS — K760 Fatty (change of) liver, not elsewhere classified: Secondary | ICD-10-CM | POA: Diagnosis not present

## 2018-03-10 ENCOUNTER — Encounter: Payer: Self-pay | Admitting: *Deleted

## 2018-03-11 ENCOUNTER — Ambulatory Visit
Admission: RE | Admit: 2018-03-11 | Discharge: 2018-03-11 | Disposition: A | Discharge status: Home / Self Care | Payer: Medicare Other | Source: Ambulatory Visit | Attending: Unknown Physician Specialty | Admitting: Unknown Physician Specialty

## 2018-03-11 ENCOUNTER — Encounter
Admission: RE | Disposition: A | Discharge status: Home / Self Care | Payer: Self-pay | Source: Ambulatory Visit | Attending: Unknown Physician Specialty

## 2018-03-11 ENCOUNTER — Ambulatory Visit: Payer: Medicare Other | Admitting: Anesthesiology

## 2018-03-11 ENCOUNTER — Encounter: Payer: Self-pay | Admitting: *Deleted

## 2018-03-11 DIAGNOSIS — M19041 Primary osteoarthritis, right hand: Secondary | ICD-10-CM | POA: Diagnosis not present

## 2018-03-11 DIAGNOSIS — K3189 Other diseases of stomach and duodenum: Secondary | ICD-10-CM | POA: Insufficient documentation

## 2018-03-11 DIAGNOSIS — F419 Anxiety disorder, unspecified: Secondary | ICD-10-CM | POA: Diagnosis not present

## 2018-03-11 DIAGNOSIS — Z79899 Other long term (current) drug therapy: Secondary | ICD-10-CM | POA: Insufficient documentation

## 2018-03-11 DIAGNOSIS — Z8711 Personal history of peptic ulcer disease: Secondary | ICD-10-CM | POA: Insufficient documentation

## 2018-03-11 DIAGNOSIS — Z880 Allergy status to penicillin: Secondary | ICD-10-CM | POA: Diagnosis not present

## 2018-03-11 DIAGNOSIS — K640 First degree hemorrhoids: Secondary | ICD-10-CM | POA: Insufficient documentation

## 2018-03-11 DIAGNOSIS — K635 Polyp of colon: Secondary | ICD-10-CM | POA: Insufficient documentation

## 2018-03-11 DIAGNOSIS — K210 Gastro-esophageal reflux disease with esophagitis: Secondary | ICD-10-CM | POA: Diagnosis not present

## 2018-03-11 DIAGNOSIS — M359 Systemic involvement of connective tissue, unspecified: Secondary | ICD-10-CM | POA: Insufficient documentation

## 2018-03-11 DIAGNOSIS — D120 Benign neoplasm of cecum: Secondary | ICD-10-CM | POA: Insufficient documentation

## 2018-03-11 DIAGNOSIS — Z888 Allergy status to other drugs, medicaments and biological substances status: Secondary | ICD-10-CM | POA: Insufficient documentation

## 2018-03-11 DIAGNOSIS — K5730 Diverticulosis of large intestine without perforation or abscess without bleeding: Secondary | ICD-10-CM | POA: Diagnosis not present

## 2018-03-11 DIAGNOSIS — F17210 Nicotine dependence, cigarettes, uncomplicated: Secondary | ICD-10-CM | POA: Insufficient documentation

## 2018-03-11 DIAGNOSIS — Z1211 Encounter for screening for malignant neoplasm of colon: Secondary | ICD-10-CM | POA: Insufficient documentation

## 2018-03-11 DIAGNOSIS — K2950 Unspecified chronic gastritis without bleeding: Secondary | ICD-10-CM | POA: Insufficient documentation

## 2018-03-11 DIAGNOSIS — F329 Major depressive disorder, single episode, unspecified: Secondary | ICD-10-CM | POA: Diagnosis not present

## 2018-03-11 DIAGNOSIS — I739 Peripheral vascular disease, unspecified: Secondary | ICD-10-CM | POA: Diagnosis not present

## 2018-03-11 DIAGNOSIS — Z86010 Personal history of colonic polyps: Secondary | ICD-10-CM | POA: Diagnosis not present

## 2018-03-11 DIAGNOSIS — M19042 Primary osteoarthritis, left hand: Secondary | ICD-10-CM | POA: Diagnosis not present

## 2018-03-11 DIAGNOSIS — Z886 Allergy status to analgesic agent status: Secondary | ICD-10-CM | POA: Diagnosis not present

## 2018-03-11 HISTORY — PX: ESOPHAGOGASTRODUODENOSCOPY (EGD) WITH PROPOFOL: SHX5813

## 2018-03-11 HISTORY — PX: COLONOSCOPY WITH PROPOFOL: SHX5780

## 2018-03-11 SURGERY — COLONOSCOPY WITH PROPOFOL
Anesthesia: General

## 2018-03-11 MED ORDER — PROPOFOL 500 MG/50ML IV EMUL
INTRAVENOUS | Status: DC | PRN
  Administered 2018-03-11: 120 ug/kg/min via INTRAVENOUS

## 2018-03-11 MED ORDER — SODIUM CHLORIDE 0.9 % IV SOLN
INTRAVENOUS | Status: DC
  Administered 2018-03-11: 1000 mL via INTRAVENOUS

## 2018-03-11 MED ORDER — PROPOFOL 10 MG/ML IV BOLUS
INTRAVENOUS | Status: DC | PRN
  Administered 2018-03-11: 36.8 mg via INTRAVENOUS

## 2018-03-11 MED ORDER — PROPOFOL 500 MG/50ML IV EMUL
INTRAVENOUS | Status: AC
  Filled 2018-03-11: qty 50

## 2018-03-11 MED ORDER — MIDAZOLAM HCL 5 MG/5ML IJ SOLN
INTRAMUSCULAR | Status: DC | PRN
  Administered 2018-03-11: 2 mg via INTRAVENOUS

## 2018-03-11 MED ORDER — MIDAZOLAM HCL 2 MG/2ML IJ SOLN
INTRAMUSCULAR | Status: AC
  Filled 2018-03-11: qty 2

## 2018-03-11 MED ORDER — LIDOCAINE HCL (PF) 2 % IJ SOLN
INTRAMUSCULAR | Status: AC
  Filled 2018-03-11: qty 10

## 2018-03-11 MED ORDER — SODIUM CHLORIDE 0.9 % IV SOLN: INTRAVENOUS | Status: DC

## 2018-03-11 NOTE — H&P (Signed)
Primary Care Physician:  Baxter Hire, MD Primary Gastroenterologist:  Dr. Vira Agar  Pre-Procedure History & Physical: HPI:  Katrina Mckinney is a 82 y.o. female is here for an endoscopy and colonoscopy. F/u colon polyps and GERD.   Past Medical History:  Diagnosis Date  . Anxiety   . Arthritis    hands  . Atrophic vaginitis   . Barrett's esophagus   . Collagen vascular disease ([place])   . Depression   . Frequency   . Gastric ulcer   . GERD (gastroesophageal reflux disease)   . Hyperlipidemia   . Incomplete bladder emptying   . Low back pain   . Nocturia   . Pneumonia 2013   in past.  . Urethral caruncle   . Urethral stricture   . Urethral syndrome     Past Surgical History:  Procedure Laterality Date  . ABDOMINAL HYSTERECTOMY    . BLADDER SURGERY    . CATARACT EXTRACTION W/PHACO Left 12/03/2015   Procedure: CATARACT EXTRACTION PHACO AND INTRAOCULAR LENS PLACEMENT (IOC);  Surgeon: Birder Robson, MD;  Location: [facility] ORS;  Service: Ophthalmology;  Laterality: Left;  [place] 44.3 AP% 23.3 CDE 10.31 Fluid Pack Lot # [id number]  . CATARACT EXTRACTION W/PHACO Right 01/28/2016   Procedure: CATARACT EXTRACTION PHACO AND INTRAOCULAR LENS PLACEMENT ([place]);  Surgeon: Birder Robson, MD;  Location: [facility] ORS;  Service: Ophthalmology;  Laterality: Right;  [place] 00:40 AP% 18.6 CDE 7.52 fluid pack lot # [id number]  . COLONOSCOPY    . COLONOSCOPY WITH PROPOFOL N/A 02/14/2015   Procedure: COLONOSCOPY WITH PROPOFOL;  Surgeon: Manya Silvas, MD;  Location: [facility] ENDOSCOPY;  Service: Endoscopy;  Laterality: N/A;  . DILATION AND CURETTAGE OF UTERUS    . ESOPHAGOGASTRODUODENOSCOPY N/A 02/14/2015   Procedure: ESOPHAGOGASTRODUODENOSCOPY (EGD);  Surgeon: Manya Silvas, MD;  Location: [facility] ENDOSCOPY;  Service: Endoscopy;  Laterality: N/A;  . NASAL SINUS SURGERY     x 3  . REDUCTION MAMMAPLASTY      Prior to Admission medications   Medication Sig Start Date End Date Taking? Authorizing  Provider  albuterol (PROVENTIL HFA) 108 (90 Base) MCG/ACT inhaler Inhale into the lungs every 6 (six) hours as needed for wheezing or shortness of breath.   Yes [provider]  cetirizine (ZYRTEC) 10 MG tablet Take 1 tablet by mouth 1 day or 1 dose.    [provider]  citalopram (CELEXA) 10 MG tablet TAKE 2 TABLET (20 MG TOTAL) BY MOUTH ONCE DAILY in am. 06/17/15   [provider]  fluticasone (FLONASE) 50 MCG/ACT nasal spray Place 1 spray into both nostrils as needed for allergies or rhinitis.    [provider]  HYDROcodone-acetaminophen (NORCO/VICODIN) 5-325 MG per tablet Take 1 tablet by mouth every 6 (six) hours as needed for moderate pain.    [provider]  mirtazapine (REMERON) 15 MG tablet Take 15 mg by mouth at bedtime.     [provider]  omeprazole (PRILOSEC) 20 MG capsule Take 20 mg by mouth daily.    [provider]  sucralfate (CARAFATE) 1 g tablet Take 1 tablet by mouth 1 day or 1 dose. 07/23/16   [provider]  traMADol (ULTRAM) 50 MG tablet Take 50 mg by mouth every 6 (six) hours as needed.    [provider]    Allergies as of 12/21/2017 - Review Complete 04/05/2017  Allergen Reaction Noted  . Aspirin Other (See Comments) 06/21/2015  . Penicillin g Nausea And Vomiting, Swelling, and  Rash 06/21/2015  . Celecoxib Other (See Comments) 06/27/2015    Family History  Problem Relation Age of Onset  . Alzheimer's disease Mother   . Alzheimer's disease Father   . Alzheimer's disease Sister   . Alzheimer's disease Brother   . Kidney disease Neg Hx   . Bladder Cancer Neg Hx   . Lung cancer Neg Hx     Social History   Socioeconomic History  . Marital status: Widowed    Spouse name: Not on file  . Number of children: Not on file  . Years of education: Not on file  . Highest education level: Not on file  Occupational History  . Not on file  Social Needs  . Financial resource strain: Not  on file  . Food insecurity:    Worry: Not on file    Inability: Not on file  . Transportation needs:    Medical: Not on file    Non-medical: Not on file  Tobacco Use  . Smoking status: Current Every Day Smoker    Packs/day: 0.50    Years: 35.00    Pack years: 17.50    Types: Cigarettes  . Smokeless tobacco: Never Used  Substance and Sexual Activity  . Alcohol use: No    Alcohol/week: 0.0 oz  . Drug use: No  . Sexual activity: Not on file  Lifestyle  . Physical activity:    Days per week: Not on file    Minutes per session: Not on file  . Stress: Not on file  Relationships  . Social connections:    Talks on phone: Not on file    Gets together: Not on file    Attends religious service: Not on file    Active member of club or organization: Not on file    Attends meetings of clubs or organizations: Not on file    Relationship status: Not on file  . Intimate partner violence:    Fear of current or ex partner: Not on file    Emotionally abused: Not on file    Physically abused: Not on file    Forced sexual activity: Not on file  Other Topics Concern  . Not on file  Social History Narrative  . Not on file    Review of Systems: See HPI, otherwise negative ROS  Physical Exam: BP 137/62   Pulse 66   Temp (!) 97 F (36.1 C) (Tympanic)   Resp 20   Ht 5\' 5"  (1.651 m)   Wt 73.5 kg (162 lb)   SpO2 94%   BMI 26.96 kg/m  General:   Alert,  pleasant and cooperative in NAD Head:  Normocephalic and atraumatic. Neck:  Supple; no masses or thyromegaly. Lungs:  Clear throughout to auscultation.    Heart:  Regular rate and rhythm. Abdomen:  Soft, nontender and nondistended. Normal bowel sounds, without guarding, and without rebound.   Neurologic:  Alert and  oriented x4;  grossly normal neurologically.  Impression/Plan: Katrina Mckinney is here for an endoscopy and colonoscopy to be performed for GERD and follow up colon polyps.  Risks, benefits, limitations, and  alternatives regarding  endoscopy and colonoscopy have been reviewed with the patient.  Questions have been answered.  All parties agreeable.   Gaylyn Cheers, MD  03/11/2018, 7:35 AM

## 2018-03-11 NOTE — Anesthesia Preprocedure Evaluation (Signed)
Anesthesia Evaluation  Patient identified by MRN, date of birth, ID band Patient awake    Reviewed: Allergy & Precautions, NPO status , Patient's Chart, lab work & pertinent test results  History of Anesthesia Complications Negative for: history of anesthetic complications  Airway Mallampati: III       Dental  (+) Missing, Chipped, Upper Dentures   Pulmonary neg sleep apnea, neg COPD, Current Smoker,           Cardiovascular (-) hypertension+ Peripheral Vascular Disease  (-) Past MI and (-) CHF (-) dysrhythmias (-) Valvular Problems/Murmurs     Neuro/Psych neg Seizures Anxiety Depression    GI/Hepatic Neg liver ROS, PUD, GERD  Medicated,  Endo/Other  neg diabetes  Renal/GU negative Renal ROS     Musculoskeletal   Abdominal   Peds  Hematology   Anesthesia Other Findings   Reproductive/Obstetrics                             Anesthesia Physical Anesthesia Plan  ASA: III  Anesthesia Plan: General   Post-op Pain Management:    Induction: Intravenous  PONV Risk Score and Plan: 2 and TIVA and Propofol infusion  Airway Management Planned: Nasal Cannula  Additional Equipment:   Intra-op Plan:   Post-operative Plan:   Informed Consent: I have reviewed the patients History and Physical, chart, labs and discussed the procedure including the risks, benefits and alternatives for the proposed anesthesia with the patient or authorized representative who has indicated his/her understanding and acceptance.     Plan Discussed with:   Anesthesia Plan Comments:         Anesthesia Quick Evaluation

## 2018-03-11 NOTE — Anesthesia Post-op Follow-up Note (Signed)
Anesthesia QCDR form completed.        

## 2018-03-11 NOTE — Transfer of Care (Signed)
Immediate Anesthesia Transfer of Care Note  Patient: Katrina Mckinney [id number]  Procedure(s) Performed: COLONOSCOPY WITH PROPOFOL (N/A ) ESOPHAGOGASTRODUODENOSCOPY (EGD) WITH PROPOFOL (N/A )  Patient Location: PACU and Endoscopy Unit  Anesthesia Type:General  Level of Consciousness: sedated  Airway & Oxygen Therapy: Patient Spontanous Breathing and Patient connected to nasal cannula oxygen  Post-op Assessment: Report given to RN and Post -op Vital signs reviewed and stable  Post vital signs: stable  Last Vitals:  Vitals Value Taken Time  BP    Temp    Pulse    Resp    SpO2      Last Pain:  Vitals:   03/11/18 0715  TempSrc: Tympanic  PainSc: 0-No pain         Complications: No apparent anesthesia complications

## 2018-03-11 NOTE — Anesthesia Postprocedure Evaluation (Signed)
Anesthesia Post Note  Patient: Katrina Mckinney [id number]  Procedure(s) Performed: COLONOSCOPY WITH PROPOFOL (N/A ) ESOPHAGOGASTRODUODENOSCOPY (EGD) WITH PROPOFOL (N/A )  Patient location during evaluation: Endoscopy Anesthesia Type: General Level of consciousness: awake and alert Pain management: pain level controlled Vital Signs Assessment: post-procedure vital signs reviewed and stable Respiratory status: spontaneous breathing and respiratory function stable Cardiovascular status: stable Anesthetic complications: no     Last Vitals:  Vitals:   03/11/18 0715  BP: 137/62  Pulse: 66  Resp: 20  Temp: (!) 36.1 C  SpO2: 94%    Last Pain:  Vitals:   03/11/18 0715  TempSrc: Tympanic  PainSc: 0-No pain                 KEPHART,WILLIAM K

## 2018-03-11 NOTE — Op Note (Signed)
[facility] Gastroenterology Patient Name: Katrina Mckinney Procedure Date: 03/11/2018 7:18 AM MRN: [id number] Account #: [id number] Date of Birth: Dec 17, 1935 Admit Type: Outpatient Age: 82 Room: [facility] ENDO ROOM 1 Gender: Female Note Status: Finalized Procedure:            Colonoscopy Indications:          High risk colon cancer surveillance: Personal history                        of colonic polyps Providers:            Manya Silvas, MD Referring MD:         Baxter Hire, MD (Referring MD) Medicines:            Propofol per Anesthesia Complications:        No immediate complications. Procedure:            Pre-Anesthesia Assessment:                       - After reviewing the risks and benefits, the patient                        was deemed in satisfactory condition to undergo the                        procedure.                       After obtaining informed consent, the colonoscope was                        passed under direct vision. Throughout the procedure,                        the patient's blood pressure, pulse, and oxygen                        saturations were monitored continuously. The                        Colonoscope was introduced through the anus and                        advanced to the the cecum, identified by appendiceal                        orifice and ileocecal valve. The colonoscopy was                        performed without difficulty. The patient tolerated the                        procedure well. The quality of the bowel preparation                        was good. Findings:      A diminutive polyp was found in the cecum. The polyp was sessile. The       polyp was removed with a jumbo cold forceps. Resection and retrieval       were complete.      A diminutive polyp was found  in the ascending colon. The polyp was       sessile. The polyp was removed with a jumbo cold forceps. Resection and       retrieval were  complete.      A single small-mouthed diverticulum was found in the transverse colon.      Internal hemorrhoids were found during endoscopy. The hemorrhoids were       small and Grade I (internal hemorrhoids that do not prolapse).      The exam was otherwise without abnormality. Impression:           - One diminutive polyp in the cecum, removed with a                        jumbo cold forceps. Resected and retrieved.                       - One diminutive polyp in the ascending colon, removed                        with a jumbo cold forceps. Resected and retrieved.                       - Diverticulosis in the transverse colon.                       - Internal hemorrhoids.                       - The examination was otherwise normal. Recommendation:       - Await pathology results. Manya Silvas, MD 03/11/2018 8:21:18 AM This report has been signed electronically. Number of Addenda: 0 Note Initiated On: 03/11/2018 7:18 AM Scope Withdrawal Time: 0 hours 11 minutes 32 seconds  Total Procedure Duration: 0 hours 18 minutes 14 seconds       [facility]

## 2018-03-11 NOTE — Addendum Note (Signed)
Addendum  created 03/11/18 0849 by Gunnar Fusi, MD   Intraprocedure Meds edited

## 2018-03-11 NOTE — Op Note (Signed)
[facility] Gastroenterology Patient Name: Katrina Mckinney Procedure Date: 03/11/2018 7:19 AM MRN: [id number] Account #: [id number] Date of Birth: 09-26-1935 Admit Type: Outpatient Age: 82 Room: [facility] ENDO ROOM 1 Gender: Female Note Status: Finalized Procedure:            Upper GI endoscopy Indications:          Follow-up of gastro-esophageal reflux disease Providers:            Manya Silvas, MD Referring MD:         Baxter Hire, MD (Referring MD) Medicines:            Propofol per Anesthesia Complications:        No immediate complications. Procedure:            Pre-Anesthesia Assessment:                       - After reviewing the risks and benefits, the patient                        was deemed in satisfactory condition to undergo the                        procedure.                       After obtaining informed consent, the endoscope was                        passed under direct vision. Throughout the procedure,                        the patient's blood pressure, pulse, and oxygen                        saturations were monitored continuously. The Endoscope                        was introduced through the mouth, and advanced to the                        second part of duodenum. The upper GI endoscopy was                        accomplished without difficulty. The patient tolerated                        the procedure well. Findings:      LA Grade B (one or more mucosal breaks greater than 5 mm, not extending       between the tops of two mucosal folds) esophagitis with no bleeding was       found 40 cm from the incisors. Biopsies were taken with a cold forceps       for histology.      Striped mildly erythematous mucosa without bleeding was found in the       gastric antrum. Biopsies were taken with a cold forceps for histology.       Biopsies were taken with a cold forceps for Helicobacter pylori testing.      The examined duodenum was  normal. Impression:           -  LA Grade B reflux esophagitis. Biopsied.                       - Erythematous mucosa in the antrum. Biopsied.                       - Normal examined duodenum. Recommendation:       - Await pathology results. Procedure Code(s):    --- Professional ---                       [id number], Esophagogastroduodenoscopy, flexible, transoral;                        with biopsy, single or multiple Diagnosis Code(s):    --- Professional ---                       K21.0, Gastro-esophageal reflux disease with esophagitis                       K31.89, Other diseases of stomach and duodenum CPT copyright 2017 [employer]. All rights reserved. The codes documented in this report are preliminary and upon coder review may  be revised to meet current compliance requirements. Manya Silvas, MD 03/11/2018 7:57:34 AM This report has been signed electronically. Number of Addenda: 0 Note Initiated On: 03/11/2018 7:19 AM      [facility]

## 2018-03-14 ENCOUNTER — Encounter: Payer: Self-pay | Admitting: Unknown Physician Specialty

## 2018-03-14 LAB — SURGICAL PATHOLOGY

## 2023-11-09 DEATH — deceased
